# Patient Record
Sex: Male | Born: 1965 | Race: Black or African American | Hispanic: No | Marital: Married | Smoking: Never smoker
Health system: Southern US, Community
[De-identification: ages and names within clinical notes are randomized; demographics above are authoritative.]

## PROBLEM LIST (undated history)

## (undated) DIAGNOSIS — I5022 Chronic systolic (congestive) heart failure: Secondary | ICD-10-CM

## (undated) DIAGNOSIS — I10 Essential (primary) hypertension: Secondary | ICD-10-CM

## (undated) DIAGNOSIS — K219 Gastro-esophageal reflux disease without esophagitis: Secondary | ICD-10-CM

## (undated) DIAGNOSIS — E785 Hyperlipidemia, unspecified: Secondary | ICD-10-CM

## (undated) DIAGNOSIS — D303 Benign neoplasm of bladder: Secondary | ICD-10-CM

## (undated) HISTORY — PX: CORONARY ARTERY BYPASS GRAFT: SHX141

## (undated) HISTORY — DX: Hyperlipidemia, unspecified: E78.5

## (undated) HISTORY — PX: NO PAST SURGERIES: SHX2092

## (undated) HISTORY — DX: Essential (primary) hypertension: I10

## (undated) HISTORY — DX: Chronic systolic (congestive) heart failure: I50.22

## (undated) HISTORY — DX: Gastro-esophageal reflux disease without esophagitis: K21.9

## (undated) HISTORY — DX: Benign neoplasm of bladder: D30.3

---

## 2010-06-19 ENCOUNTER — Emergency Department (HOSPITAL_BASED_OUTPATIENT_CLINIC_OR_DEPARTMENT_OTHER)
Admission: EM | Admit: 2010-06-19 | Discharge: 2010-06-20 | Payer: Self-pay | Source: Home / Self Care | Admitting: Emergency Medicine

## 2010-07-12 ENCOUNTER — Ambulatory Visit: Payer: Self-pay | Admitting: Internal Medicine

## 2010-07-12 ENCOUNTER — Encounter: Payer: Self-pay | Admitting: Internal Medicine

## 2010-07-12 DIAGNOSIS — E785 Hyperlipidemia, unspecified: Secondary | ICD-10-CM

## 2010-07-12 DIAGNOSIS — B350 Tinea barbae and tinea capitis: Secondary | ICD-10-CM | POA: Insufficient documentation

## 2010-07-12 DIAGNOSIS — I10 Essential (primary) hypertension: Secondary | ICD-10-CM | POA: Insufficient documentation

## 2010-08-13 ENCOUNTER — Encounter: Payer: Self-pay | Admitting: Internal Medicine

## 2010-08-13 ENCOUNTER — Ambulatory Visit
Admission: RE | Admit: 2010-08-13 | Discharge: 2010-08-13 | Payer: Self-pay | Source: Home / Self Care | Attending: Internal Medicine | Admitting: Internal Medicine

## 2010-08-13 LAB — CONVERTED CEMR LAB
Albumin: 4.6 g/dL (ref 3.5–5.2)
Alkaline Phosphatase: 97 units/L (ref 39–117)
CO2: 30 meq/L (ref 19–32)
Chloride: 102 meq/L (ref 96–112)
Creatinine, Ser: 0.8 mg/dL (ref 0.40–1.50)
HDL: 44 mg/dL (ref >39)
LDL Cholesterol: 255 mg/dL — ABNORMAL HIGH (ref 0–99)
Potassium: 4.6 meq/L (ref 3.5–5.3)
TSH: 1.772 microintl units/mL (ref 0.350–4.500)
Total Bilirubin: 0.5 mg/dL (ref 0.3–1.2)
Total CHOL/HDL Ratio: 7.2
Total Protein: 7.4 g/dL (ref 6.0–8.3)
VLDL: 17 mg/dL (ref 0–40)

## 2010-08-14 ENCOUNTER — Telehealth: Payer: Self-pay | Admitting: Internal Medicine

## 2010-08-19 ENCOUNTER — Telehealth: Payer: Self-pay | Admitting: Internal Medicine

## 2010-08-21 ENCOUNTER — Encounter: Payer: Self-pay | Admitting: Internal Medicine

## 2010-09-03 NOTE — Assessment & Plan Note (Signed)
Summary: new to [facility]   Vital Signs:  Patient profile:   45 year old male Height:      73.5 inches Weight:      180.50 pounds BMI:     23.58 O2 Sat:      99 % on Room air Temp:     97.8 degrees F oral Pulse rate:   89 / minute Resp:     18 per minute BP sitting:   140 / 100  (right arm) Cuff size:   large  Vitals Entered By: Glendell Docker CMA (July 12, 2010 1:15 PM)  O2 Flow:  Room air CC: New patient  Is Patient Diabetic? No Pain Assessment Patient in pain? no      Comments establish care   Primary Care Provider:  Dondra Spry DO  CC:  New patient .  History of Present Illness: 45 y/o AA male to establish he was seen in nov in ER for perionychia - resolved with doxy doxy also helped with chronic rash on right side of face  he notes hx of elevated bp in the past occ checks bp at pharm.  SBP 140's and DBP 90-s no hx of coronary artery disease or cva  also reports hx of hyperlipidemia prev tx x 1 yr with lipitor 2005-2006 never followed up with PCP   Preventive Screening-Counseling & Management  Alcohol-Tobacco     Alcohol drinks/day: <1     Smoking Status: never  Caffeine-Diet-Exercise     Caffeine use/day: 2-5  beverages daily     Does Patient Exercise: no  Allergies (verified): No Known Drug Allergies  Past History:  Past Medical History: Hyperlipidemia - previously on lipitor and simvastatin Hypertension - hx of elevated BP readings  Past Surgical History: Denies surgical history    Family History: Family History Diabetes - GM no colon or prostate ca - no  raised by grandparents  Social History: Occupation: Merchandiser, 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         denies sexual dysfunction  Physical Exam  General:  alert, well-developed, and well-nourished.   Head:  normocephalic and atraumatic.   Eyes:  pupils equal, pupils round, and pupils reactive to light.   Ears:  R ear normal and L ear normal.   Mouth:  good dentition and pharynx pink and moist.   Neck:  No deformities, masses, or tenderness noted. Lungs:  normal respiratory effort, normal breath sounds, no crackles, and no wheezes.   Heart:  normal rate, regular rhythm, no murmur, and no gallop.   Abdomen:  soft, non-tender, normal bowel sounds, no masses, no hepatomegaly, and no splenomegaly.   Extremities:  No lower extremity edema Neurologic:  cranial nerves II-XII intact and gait normal.   Psych:  normally interactive, good eye contact, not anxious appearing, and not depressed appearing.     Impression & Recommendations:  Problem # 1:  HYPERTENSION (ICD-401.9)  His updated medication list for this problem includes:    Losartan Potassium 25 Mg Tabs (Losartan potassium) ..... One by mouth once daily    Amlodipine Besylate 2.5 Mg Tabs (Amlodipine besylate) ..... One by mouth once daily  Orders: T-Basic Metabolic Panel [id number]) T-TSH [phone])  BP today: 140/100  Problem # 2:  HYPERLIPIDEMIA (ICD-272.4) Pt counseled on diet and exercise. I provided educational material.  Orders: T-Hepatic Function [id number]) T-Lipid Profile [phone]) T-TSH [id number]) CRP, high sensitivity-[facility] ([id number])  Problem # 3:  TINEA BARBAE (ICD-110.0) use metrogel as directed improved with prev use of doxycycline  Complete Medication List: 1)  Losartan Potassium 25 Mg Tabs (Losartan potassium) .... One by mouth once daily 2)  Amlodipine Besylate 2.5 Mg Tabs (Amlodipine besylate) .... One by mouth once daily 3)  Metrogel 1 % Gel (Metronidazole) .... Apply two times a day x 2  weeks  Patient Instructions: 1)  Please schedule a follow-up appointment in 1 month. Prescriptions: METROGEL 1 % GEL (METRONIDAZOLE) apply two times a day x 2 weeks  #2 week x 1   Entered and Authorized by:   D. Thomos Lemons DO   Signed by:   D. Thomos Lemons DO on 07/12/2010   Method used:   [facility]        [facility] [phone][facility] (retail)       [address]       [place], [place]  [address]       Ph: [phone]       Fax: [phone]   RxID:   [id number] AMLODIPINE BESYLATE 2.5 MG TABS (AMLODIPINE BESYLATE) one by mouth once daily  #30 x 2   Entered and Authorized by:   D. Thomos Lemons DO   Signed by:   D. Thomos Lemons DO on 07/12/2010   Method used:   [facility] to        [facility] [phone]* (retail)       [address]       [place], [place]  [address]       Ph: [phone]       Fax: [phone]   RxID:   [id number] LOSARTAN POTASSIUM 25 MG TABS (LOSARTAN POTASSIUM) one by mouth once daily  #30 x 2   Entered and Authorized by:   D. Thomos Lemons DO   Signed by:   D. Thomos Lemons DO on 07/12/2010   Method used:   [facility] to        [facility] [phone]* (retail)       [address]       [place], [place]  [address]       Ph: [phone]       Fax: [phone]   RxID:   [id number]    Orders Added: 1)  T-Basic Metabolic Panel [id number] 2)  T-Hepatic Function [id number] 3)  T-Lipid Profile [80061-22930] 4)  T-TSH [[id number]] 5)  CRP, high sensitivity-[facility] [[id number]] 6)  New Patient Level III [99203]   Immunization History:  Tetanus/Td Immunization History:    Tetanus/Td:  historical (02/11/2005)  Influenza Immunization History:    Influenza:  declined (07/05/2010)   Contraindications/Deferment of Procedures/Staging:    Test/Procedure: FLU VAX    Reason for deferment: patient declined   Immunization History:  Tetanus/Td Immunization  History:    Tetanus/Td:  Historical (02/11/2005)  Influenza Immunization History:    Influenza:  Declined (07/05/2010)  Current Allergies (reviewed today): No known allergies     Appended Document: new to [facility]    Clinical Lists Changes  Orders: Added new Service order of EKG w/  Interpretation (93000) - Signed

## 2010-09-05 NOTE — Progress Notes (Signed)
Summary: Prior Auth Crestor 20mg   Phone Note Outgoing Call   Call placed by: Darral Dash Call placed to: Insurer Details for Reason: Prior Auth Summary of Call: Prior Auth for Crestor 20mg     form requested    Initial call taken by: Darral Dash,  August 19, 2010 9:02 AM  Follow-up for Phone Call        Form received and forwarded to Provider for completion and signature. Nicki Guadalajara Fergerson CMA Duncan Dull)  August 19, 2010 3:54 PM   Additional Follow-up for Phone Call Additional follow up Details #1::        they will not authorize crestor change to lipitor - see rx Additional Follow-up by: D. Thomos Lemons DO,  August 21, 2010 12:33 PM    Additional Follow-up for Phone Call Additional follow up Details #2::    call placed to patient at [phone], he has been advised of medication change. [phone], spoke with Pharmacy tech Johnathan Hausen, she was informed Crestor rx  was changed to Lipitor. Crestor Rx has been cancelled Follow-up by: Glendell Docker CMA,  August 21, 2010 3:20 PM  New/Updated Medications: LIPITOR 40 MG TABS (ATORVASTATIN CALCIUM) one by mouth once daily Prescriptions: LIPITOR 40 MG TABS (ATORVASTATIN CALCIUM) one by mouth once daily  #90 x 1   Entered and Authorized by:   D. Thomos Lemons DO   Signed by:   D. Thomos Lemons DO on 08/21/2010   Method used:   [facility]ronically to        [facility] [phone]* (retail)       [address]       [place], [place]  [address]       Ph: [phone]       Fax: [phone]   RxID:   [id number]

## 2010-09-05 NOTE — Progress Notes (Signed)
Summary: lab results  Phone Note Outgoing Call   Summary of Call: call pt - cholesterol very high - LDL 255.  I advise pt start choesterol medication.  see rx pt needs repeat FLT ad LFTs in 2 months, then OV Initial call taken by: D. Thomos Lemons DO,  August 14, 2010 8:43 AM  Follow-up for Phone Call        Pt notified per Dr Olegario Messier instruction. F/u with Dr Artist Pais scheduled for 10/15/10 @ 4:15pm. Lab order has been entered and faxed to the lab. Appt reminders have been mailed to the pt. Nicki Guadalajara Fergerson CMA (AAMA)  August 14, 2010 9:26 AM     New/Updated Medications: CRESTOR 20 MG TABS (ROSUVASTATIN CALCIUM) one by mouth once daily Prescriptions: CRESTOR 20 MG TABS (ROSUVASTATIN CALCIUM) one by mouth once daily  #30 x 3   Entered and Authorized by:   D. Thomos Lemons DO   Signed by:   D. Thomos Lemons DO on 08/14/2010   Method used:   [facility] to        [facility] [phone]* (retail)       [address]       [place], [place]  [address]       Ph: [phone]       Fax: [phone]   RxID:   [id number]

## 2010-09-05 NOTE — Assessment & Plan Note (Signed)
Summary: 1 month follow up/mhf   Vital Signs:  Patient profile:   45 year old male Height:      73.5 inches Weight:      180 pounds BMI:     23.51 O2 Sat:      98 % on Room air Temp:     98.0 degrees F oral Pulse rate:   70 / minute Resp:     16 per minute BP sitting:   108 / 80  (left arm) Cuff size:   large  Vitals Entered By: Glendell Docker CMA (August 13, 2010 3:54 PM)  O2 Flow:  Room air CC: 1 Month Follow up Is Patient Diabetic? No Pain Assessment Patient in pain? no      Comments no concerns, sleep has improved, would like a rx for doxycline to hel p clear up face, works better than metrogel   Primary Care Provider:  D. Thomos Lemons DO  CC:  1 Month Follow up.  History of Present Illness:  Hypertension Follow-Up      This is a 45 year old man who presents for Hypertension follow-up.  The patient denies lightheadedness and edema.  The patient denies the following associated symptoms: chest pain.  Compliance with medications (by patient report) has been near 100%.  The patient reports that dietary compliance has been fair.    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   Lungs:  normal respiratory effort, normal breath sounds, no crackles, and no wheezes.   Heart:  normal rate, regular rhythm, no murmur, and no gallop.     Skin:  ruddy hyperpigmented areas - right face  Psych:  normally interactive, good eye contact, not anxious appearing, and not depressed appearing.     Impression & Recommendations:  Problem # 1:  HYPERTENSION (ICD-401.9) Assessment Improved monitor BP at home.  If SBP < 100, pt to DC amlodipine  His updated medication list for this problem includes:    Losartan Potassium 25 Mg Tabs (Losartan potassium) ..... One by mouth once daily    Amlodipine Besylate 2.5 Mg Tabs (Amlodipine besylate) ..... One by mouth once daily  Orders: T-Basic Metabolic Panel [id number])  BP today: 108/80 Prior BP: 140/100 (07/12/2010)  Problem # 2:  HYPERLIPIDEMIA (ICD-272.4) repeat labs  Orders: T-Hepatic Function [id number]) T-Lipid Profile [phone]) T-TSH [id number])  Problem # 3:  TINEA BARBAE (ICD-110.0) Assessment: Deteriorated use doxy as directed also use metrogel for maintenance  Complete Medication List: 1)  Losartan Potassium 25 Mg Tabs (Losartan potassium) .... One by mouth once daily 2)  Amlodipine Besylate 2.5 Mg Tabs (Amlodipine besylate) .... One by mouth once daily 3)  Doxycycline Hyclate 100 Mg Tabs (Doxycycline hyclate) .... One by mouth once daily  Patient Instructions: 1)  Please schedule a follow-up appointment in 4 months. Prescriptions: AMLODIPINE BESYLATE 2.5 MG TABS (AMLODIPINE BESYLATE) one by mouth once daily  #90  x 1   Entered and Authorized by:   D. Thomos Lemons DO   Signed by:   D. Thomos Lemons DO on 08/13/2010   Method used:   [facility] to        [facility] [phone]* (retail)       [address]       [place], [place]  [address]       Ph: [phone]       Fax: [phone]   RxID:   [id number] LOSARTAN POTASSIUM 25 MG TABS (LOSARTAN POTASSIUM) one by mouth once daily  #90 x 1   Entered and Authorized by:   D. Thomos Lemons DO   Signed by:   D. Thomos Lemons DO on 08/13/2010   Method used:   Electronically to         [facility] [phone]* (retail)       [address]       [place], [place]  [address]       Ph: [phone]       Fax: [phone]   RxID:   [id number] DOXYCYCLINE HYCLATE 100 MG TABS (DOXYCYCLINE HYCLATE) one by mouth once daily  #10 x 0   Entered and Authorized by:   D. Thomos Lemons DO   Signed by:   D. Thomos Lemons DO on 08/13/2010   Method used:   Electronically to        [facility] [phone]* (retail)       [address]       [place], [place]  [address]       Ph: [phone]       Fax: [phone]   RxID:   [id number]    Orders Added: 1)  T-Basic Metabolic Panel [id number] 2)  T-Hepatic Function [id number] 3)  T-Lipid Profile [80061-22930] 4)  T-TSH [[id number]] 5)  Est. Patient Level III [[address]]    Current Allergies (reviewed today): No known allergies

## 2010-09-11 NOTE — Medication Information (Signed)
Summary: Prior autho for Crestor/[facility]  Prior autho for [employer]   Imported By: Sherian Rein 09/03/2010 09:24:55  _____________________________________________________________________  External Attachment:    Type:   Image     Comment:   External Document

## 2010-10-10 LAB — CONVERTED CEMR LAB
ALT: 27 units/L (ref 0–53)
AST: 20 units/L (ref 0–37)
Albumin: 4.6 g/dL (ref 3.5–5.2)
Alkaline Phosphatase: 106 units/L (ref 39–117)
Cholesterol: 171 mg/dL (ref 0–200)
HDL: 48 mg/dL (ref >39)
Indirect Bilirubin: 0.5 mg/dL (ref 0.0–0.9)
Total Protein: 7 g/dL (ref 6.0–8.3)
Triglycerides: 112 mg/dL (ref <150)

## 2010-10-14 ENCOUNTER — Telehealth: Payer: Self-pay | Admitting: Internal Medicine

## 2010-10-15 ENCOUNTER — Ambulatory Visit: Payer: Self-pay | Admitting: Internal Medicine

## 2010-10-18 ENCOUNTER — Ambulatory Visit (INDEPENDENT_AMBULATORY_CARE_PROVIDER_SITE_OTHER): Admitting: Internal Medicine

## 2010-10-18 ENCOUNTER — Encounter: Payer: Self-pay | Admitting: Internal Medicine

## 2010-10-18 DIAGNOSIS — B350 Tinea barbae and tinea capitis: Secondary | ICD-10-CM

## 2010-10-18 DIAGNOSIS — E785 Hyperlipidemia, unspecified: Secondary | ICD-10-CM

## 2010-10-18 DIAGNOSIS — I10 Essential (primary) hypertension: Secondary | ICD-10-CM

## 2010-10-22 NOTE — Progress Notes (Signed)
Summary: Call  A Nurse Report  Phone Note Other Incoming   Caller: CALL A NURSE Summary of Call: Office Message from Date: 10/11/2010 12:00:00 AM Time of Call: [phone] Faxed To: [facility] CallerAlferd Obryant Fax Number: [phone] Facility: N/A Patient: Robert Bennett, Beckers DOB: [birth year] Phone: [phone] Provider: Thomos Lemons Message: See info below Regarding Appointment: Yes Appt Date: 10/15/2010 Appt Time: 4:15:00 PM Provider: Thomos Lemons Reason: Details: Work schedule changed. Outcome: Instructed patient to call back on the next business day. Message Taken by: Delrae Rend, CSR FAX Call-A Initial call taken by: Glendell Docker CMA,  October 14, 2010 8:21 AM

## 2010-11-05 NOTE — Assessment & Plan Note (Signed)
Summary: 2 mth follow up/canceled in error/ss   Vital Signs:  Patient profile:   45 year old male Height:      73.5 inches Weight:      183 pounds BMI:     23.90 O2 Sat:      100 % on Room air Temp:     98.1 degrees F oral Pulse rate:   71 / minute Resp:     16 per minute BP sitting:   100 / 60  (right arm) Cuff size:   large  Vitals Entered By: Glendell Docker CMA (October 18, 2010 7:56 AM)  O2 Flow:  Room air CC: 2 month follow up  Is Patient Diabetic? No Pain Assessment Patient in pain? no      Comments referral to dermatologist, request refill on metrogel,    Primary Care Provider:  DThomos Lemons DO  CC:  2 month follow up .  History of Present Illness:  Hypertension Follow-Up      This is a 45 year old man who presents for Hypertension follow-up.  The patient reports lightheadedness.  The patient denies the following associated symptoms: chest pain and chest pressure.  Compliance with medications (by patient report) has been near 100%.  The patient reports that dietary compliance has been fair.    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   Lungs:  normal respiratory effort, normal breath sounds, no crackles, and no wheezes.   Heart:  normal rate, regular rhythm, no murmur, and no gallop.   Skin:  scattered hyperpigemented  areas mainly on right side of face   Impression & Recommendations:  Problem # 1:  TINEA BARBAE (ICD-110.0) Assessment Deteriorated initial use of doxy assoc partial clearing. unclear whether resistance to doxy developing change to minocycline take 50 mg x 1 month we discussed possible photosensitivity  Problem # 2:  HYPERTENSION (ICD-401.9) pt experiencing some fatigue in afternoon bp is low normal dc amlodipine  The following medications were removed from the medication list:    Amlodipine Besylate 2.5 Mg Tabs (Amlodipine besylate) ..... One by mouth once daily His updated medication list for this problem includes:    Losartan Potassium 25 Mg Tabs (Losartan potassium) ..... One by mouth once daily  BP today: 100/60 Prior BP: 108/80 (08/13/2010)  Labs Reviewed: K+: 4.6 (08/13/2010) Creat: : 0.80 (08/13/2010)   Chol: 171 (10/10/2010)   HDL: 48 (10/10/2010)   LDL: 101 (10/10/2010)   TG: 112 (10/10/2010)  Problem # 3:  HYPERLIPIDEMIA (ICD-272.4) Assessment: Improved  His updated medication list for this problem includes:    Lipitor 40 Mg Tabs (Atorvastatin calcium) ..... One by mouth once daily  Labs Reviewed: SGOT: 20 (10/10/2010)   SGPT: 27 (10/10/2010)   HDL:48 (10/10/2010), 44 (08/13/2010)  LDL:101 (10/10/2010), 255 (08/13/2010)  Chol:171 (10/10/2010), 316 (08/13/2010)  Trig:112 (10/10/2010), 87 (08/13/2010)  Complete Medication List: 1)  Losartan Potassium 25 Mg Tabs (Losartan potassium) .... One by mouth once daily 2)  Lipitor 40 Mg Tabs (Atorvastatin calcium) .... One by mouth once daily 3)  Minocycline Hcl 50 Mg Caps (Minocycline hcl) .... One by mouth once daily  Patient Instructions: 1)  Please schedule a follow-up appointment in 2 months. 2)  Stop amlodipine 3)  Do not take minocycline with any dairy products Prescriptions: MINOCYCLINE HCL 50 MG CAPS (MINOCYCLINE HCL) one by mouth once daily  #30 x 0   Entered and Authorized by:   D. Thomos Lemons DO   Signed  by:   D. Thomos Lemons DO on 10/18/2010   Method used:   [facility]ronically to        [facility] [phone]* (retail)       [address]       [place], [place]  [address]       Ph: [phone]       Fax: [phone]   RxID:   [id number]    Orders Added: 1)  Est. Patient Level III [24401]

## 2011-03-27 ENCOUNTER — Other Ambulatory Visit: Payer: Self-pay | Admitting: Internal Medicine

## 2011-04-03 ENCOUNTER — Encounter: Payer: Self-pay | Admitting: Internal Medicine

## 2011-04-04 ENCOUNTER — Ambulatory Visit (INDEPENDENT_AMBULATORY_CARE_PROVIDER_SITE_OTHER): Admitting: Internal Medicine

## 2011-04-04 ENCOUNTER — Encounter: Payer: Self-pay | Admitting: Internal Medicine

## 2011-04-04 DIAGNOSIS — K3189 Other diseases of stomach and duodenum: Secondary | ICD-10-CM

## 2011-04-04 DIAGNOSIS — K429 Umbilical hernia without obstruction or gangrene: Secondary | ICD-10-CM

## 2011-04-04 DIAGNOSIS — R1013 Epigastric pain: Secondary | ICD-10-CM

## 2011-04-06 DIAGNOSIS — R1013 Epigastric pain: Secondary | ICD-10-CM | POA: Insufficient documentation

## 2011-04-06 DIAGNOSIS — K429 Umbilical hernia without obstruction or gangrene: Secondary | ICD-10-CM | POA: Insufficient documentation

## 2011-04-06 NOTE — Assessment & Plan Note (Signed)
Begin ppi with dexilant samples po qd x 3 weeks given. Followup if no improvement or worsening.

## 2011-04-06 NOTE — Assessment & Plan Note (Signed)
asx and reducible. Discussed warning sx's that would prompt immediate evaluation. States understanding.

## 2011-04-06 NOTE — Progress Notes (Signed)
  Subjective:    Patient ID: Robert Bennett, male    DOB: Jun 15, 1966, 45 y.o.   MRN: [id number]  HPI Pt presents to clinic for followup of multiple medical problems. Notes abdominal discomfort without radiation, nausea or vomitting. No change in bowel habits and no relationship to food. Located epigastric episode. Has associated increased abdominal growling in the am. No exacerbating or alleviating factors. Has midline abdominal bulging with situps but not at location of pain. No other complaints.  Past Medical History  Diagnosis Date  . Hyperlipidemia     Previously on lipitor and simvastatin  . Hypertension     history of elevated BP readings   Past Surgical History  Procedure Date  . No past surgeries     reports that he has never smoked. He has never used smokeless tobacco. He reports that he drinks alcohol. His drug history not on file. family history is negative for Cancer. No Known Allergies   Review of Systems see hpi     Objective:   Physical Exam  Nursing note and vitals reviewed. Constitutional: He appears well-developed and well-nourished. No distress.  HENT:  Head: Normocephalic and atraumatic.  Right Ear: External ear normal.  Left Ear: External ear normal.  Eyes: Conjunctivae are normal. No scleral icterus.  Abdominal: Soft. Bowel sounds are normal. He exhibits no distension and no mass. There is tenderness. There is no rebound and no guarding.       Slight tenderness epigastric area. Midline ventral diathesis with valsalva. +small umbilibal hernia without redness, tenderness, firmness or warmth. +reducible  Skin: He is not diaphoretic.          Assessment & Plan:

## 2011-08-07 ENCOUNTER — Other Ambulatory Visit: Payer: Self-pay | Admitting: *Deleted

## 2011-08-07 MED ORDER — LOSARTAN POTASSIUM 25 MG PO TABS: 25.0000 mg | ORAL_TABLET | Freq: Every day | ORAL | Status: DC

## 2011-08-07 NOTE — Telephone Encounter (Signed)
[facility]'s Amlodipine and Losartan Potassium. Per 10/18/10 office note, amlodipine was denied as pt was experiencing symptoms of low BP.  Spoke to pt, he forgot that he was supposed to be off of it and states that he had been out of it and had not been taking it. Advised pt I would send refill of Losartan to pharmacy.

## 2011-11-04 ENCOUNTER — Other Ambulatory Visit: Payer: Self-pay | Admitting: Internal Medicine

## 2011-11-04 NOTE — Telephone Encounter (Signed)
Refill sent to pharmacy. Office visit due.

## 2011-12-02 ENCOUNTER — Other Ambulatory Visit: Payer: Self-pay | Admitting: Internal Medicine

## 2011-12-03 NOTE — Telephone Encounter (Signed)
One month with rf 1. No appt scheduled. Needs appt

## 2011-12-03 NOTE — Telephone Encounter (Signed)
Rx refill sent to pharmacy. 

## 2012-02-06 ENCOUNTER — Other Ambulatory Visit: Payer: Self-pay | Admitting: Internal Medicine

## 2012-02-06 NOTE — Telephone Encounter (Signed)
14 day supply of atorvastatin sent to pharmacy as Robert Bennett is past due for follow up. Previous refill stated Robert Bennett would be due for office visit for further refills and f/u has not been arranged. Please call Robert Bennett to schedule fasting follow up with Dr Rodena Medin.

## 2012-02-06 NOTE — Telephone Encounter (Signed)
Informed patient that a 14 day supply of atorvastatin has been sent to pharmacy and he did make a fasting follow for 02/16/12

## 2012-02-16 ENCOUNTER — Encounter: Payer: Self-pay | Admitting: Internal Medicine

## 2012-02-16 ENCOUNTER — Ambulatory Visit (INDEPENDENT_AMBULATORY_CARE_PROVIDER_SITE_OTHER): Admitting: Internal Medicine

## 2012-02-16 VITALS — BP 98/76 | HR 65 | Temp 98.0°F | Resp 16 | Ht 74.0 in | Wt 193.0 lb

## 2012-02-16 DIAGNOSIS — I10 Essential (primary) hypertension: Secondary | ICD-10-CM

## 2012-02-16 DIAGNOSIS — Z79899 Other long term (current) drug therapy: Secondary | ICD-10-CM

## 2012-02-16 DIAGNOSIS — E785 Hyperlipidemia, unspecified: Secondary | ICD-10-CM

## 2012-02-16 LAB — CBC WITH DIFFERENTIAL/PLATELET
Eosinophils Absolute: 0 10*3/uL (ref 0.0–0.7)
Hemoglobin: 15.3 g/dL (ref 13.0–17.0)
Lymphocytes Relative: 45 % (ref 12–46)
Lymphs Abs: 1.9 10*3/uL (ref 0.7–4.0)
MCH: 30.1 pg (ref 26.0–34.0)
Monocytes Relative: 13 % — ABNORMAL HIGH (ref 3–12)
Neutrophils Relative %: 40 % — ABNORMAL LOW (ref 43–77)
Platelets: 312 10*3/uL (ref 150–400)
RBC: 5.09 MIL/uL (ref 4.22–5.81)
WBC: 4.2 10*3/uL (ref 4.0–10.5)

## 2012-02-16 LAB — LIPID PANEL
HDL: 42 mg/dL (ref >39)
LDL Cholesterol: 182 mg/dL — ABNORMAL HIGH (ref 0–99)
Total CHOL/HDL Ratio: 5.7 Ratio
VLDL: 16 mg/dL (ref 0–40)

## 2012-02-16 LAB — HEPATIC FUNCTION PANEL
AST: 16 U/L (ref 0–37)
Albumin: 4.1 g/dL (ref 3.5–5.2)
Bilirubin, Direct: 0.1 mg/dL (ref 0.0–0.3)
Total Bilirubin: 0.6 mg/dL (ref 0.3–1.2)

## 2012-02-16 LAB — BASIC METABOLIC PANEL
CO2: 30 mEq/L (ref 19–32)
Chloride: 102 mEq/L (ref 96–112)
Glucose, Bld: 85 mg/dL (ref 70–99)
Potassium: 4.3 mEq/L (ref 3.5–5.3)
Sodium: 141 mEq/L (ref 135–145)

## 2012-02-16 MED ORDER — LOSARTAN POTASSIUM 25 MG PO TABS: 25.0000 mg | ORAL_TABLET | Freq: Every day | ORAL | Status: DC

## 2012-02-16 MED ORDER — ATORVASTATIN CALCIUM 40 MG PO TABS: 40.0000 mg | ORAL_TABLET | Freq: Every day | ORAL | Status: DC

## 2012-02-16 MED ORDER — METRONIDAZOLE 1 % EX GEL: Freq: Two times a day (BID) | CUTANEOUS | Status: DC

## 2012-02-16 NOTE — Progress Notes (Signed)
  Subjective:    Patient ID: Robert Bennett, male    DOB: 12/16/1965, 46 y.o.   MRN: [id number]  HPI Pt presents to clinic for followup of multiple medical problems. Last visit 8/12. Tolerating medication without adverse effect. BP low nl today but denies dizziness or fatigue. Requests refill of metrogel for face prn.   Past Medical History  Diagnosis Date  . Hyperlipidemia     Previously on lipitor and simvastatin  . Hypertension     history of elevated BP readings   Past Surgical History  Procedure Date  . No past surgeries     reports that he has never smoked. He has never used smokeless tobacco. He reports that he drinks alcohol. His drug history not on file. family history is negative for Cancer. No Known Allergies    Review of Systems see hpi     Objective:   Physical Exam  Physical Exam  Nursing note and vitals reviewed. Constitutional: Appears well-developed and well-nourished. No distress.  HENT:  Head: Normocephalic and atraumatic.  Right Ear: External ear normal.  Left Ear: External ear normal.  Eyes: Conjunctivae are normal. No scleral icterus.  Neck: Neck supple. Carotid bruit is not present.  Cardiovascular: Normal rate, regular rhythm and normal heart sounds.  Exam reveals no gallop and no friction rub.   No murmur heard. Pulmonary/Chest: Effort normal and breath sounds normal. No respiratory distress. He has no wheezes. no rales.  Lymphadenopathy:    He has no cervical adenopathy.  Neurological:Alert.  Skin: Skin is warm and dry. Not diaphoretic.  Psychiatric: Has a normal mood and affect.        Assessment & Plan:

## 2012-02-16 NOTE — Assessment & Plan Note (Signed)
Obtain lipid/lft. Rf statin.

## 2012-02-16 NOTE — Patient Instructions (Signed)
Please schedule fasting labs prior to next visit chem7-v58.69 and lipid/lft-272.4 

## 2012-02-16 NOTE — Assessment & Plan Note (Signed)
Low nl isolated reading. Asx. Continue ARB but recommend outpt bp log to be called to clinic for review. Obtain cbc and chem7

## 2012-02-24 ENCOUNTER — Telehealth: Payer: Self-pay | Admitting: *Deleted

## 2012-02-24 DIAGNOSIS — Z79899 Other long term (current) drug therapy: Secondary | ICD-10-CM

## 2012-02-24 DIAGNOSIS — E785 Hyperlipidemia, unspecified: Secondary | ICD-10-CM

## 2012-02-24 NOTE — Telephone Encounter (Signed)
Patient informed; understood & agreed. Lab orders for 01.2014 OV placed/SLS

## 2012-02-24 NOTE — Telephone Encounter (Signed)
Message copied by Regis Bill on Tue Feb 24, 2012 11:42 AM ------      Message from: Edwyna Perfect      Created: Mon Feb 23, 2012  8:56 PM       Chol quite high. Believe he was out of chol med temporarily. If so then take daily and needs lipid/lft 272.4 with next visit

## 2012-04-12 ENCOUNTER — Ambulatory Visit (INDEPENDENT_AMBULATORY_CARE_PROVIDER_SITE_OTHER): Admitting: Internal Medicine

## 2012-04-12 ENCOUNTER — Encounter: Payer: Self-pay | Admitting: Internal Medicine

## 2012-04-12 VITALS — BP 116/82 | HR 76 | Temp 98.2°F | Resp 16 | Wt 194.0 lb

## 2012-04-12 DIAGNOSIS — I10 Essential (primary) hypertension: Secondary | ICD-10-CM

## 2012-04-12 DIAGNOSIS — K429 Umbilical hernia without obstruction or gangrene: Secondary | ICD-10-CM

## 2012-04-12 DIAGNOSIS — R1013 Epigastric pain: Secondary | ICD-10-CM

## 2012-04-12 MED ORDER — ESOMEPRAZOLE MAGNESIUM 40 MG PO CPDR: 40.0000 mg | DELAYED_RELEASE_CAPSULE | Freq: Every day | ORAL | Status: DC

## 2012-04-22 DIAGNOSIS — R1013 Epigastric pain: Secondary | ICD-10-CM | POA: Insufficient documentation

## 2012-04-22 NOTE — Progress Notes (Signed)
  Subjective:    Patient ID: Robert Bennett, male    DOB: [birth year], 46 y.o.   MRN: [id number]  HPI patient presents to clinic for evaluation of abdominal discomfort. Notes abdominal bloating and grumbling of the stomach over the past four months. Denies true pain or tenderness. No association with food. No alleviating or exacerbating factors. Wondered if his umbilical hernia was the cause. The hernia is reducible and not hard or tender. States he is taking his blood pressure medication only one day a week. Blood pressure today reviewed as normotensive.  Past Medical History  Diagnosis Date  . Hyperlipidemia     Previously on lipitor and simvastatin  . Hypertension     history of elevated BP readings   Past Surgical History  Procedure Date  . No past surgeries     reports that he has never smoked. He has never used smokeless tobacco. He reports that he drinks alcohol. His drug history not on file. family history is negative for Cancer. No Known Allergies   Review of Systems see hpi     Objective:   Physical Exam  Nursing note and vitals reviewed. Constitutional: He appears well-developed and well-nourished. No distress.  HENT:  Head: Normocephalic and atraumatic.  Right Ear: External ear normal.  Left Ear: External ear normal.  Eyes: Conjunctivae normal are normal. No scleral icterus.  Abdominal: Soft. Bowel sounds are normal. He exhibits no distension and no mass. There is no rebound and no guarding.        Small no hernia. Easily reducible. Nontender and not hard. There is minimal epigastric discomfort to palpation. No rebound guarding or rigidity.  Skin: He is not diaphoretic.          Assessment & Plan:

## 2012-04-22 NOTE — Assessment & Plan Note (Signed)
Only taking one time a week. Stop medication. Monitor blood pressure and report results for review.

## 2012-04-22 NOTE — Assessment & Plan Note (Signed)
Asymptomatic. No indication for surgery. Continue to monitor

## 2012-04-22 NOTE — Assessment & Plan Note (Signed)
Begin Nexium 40 mg daily #20 given. Followup if no improvement or worsening.

## 2012-05-10 ENCOUNTER — Other Ambulatory Visit: Payer: Self-pay

## 2012-05-10 MED ORDER — ESOMEPRAZOLE MAGNESIUM 40 MG PO CPDR: 40.0000 mg | DELAYED_RELEASE_CAPSULE | Freq: Every day | ORAL | Status: DC

## 2012-07-04 ENCOUNTER — Other Ambulatory Visit: Payer: Self-pay | Admitting: Internal Medicine

## 2012-07-05 NOTE — Telephone Encounter (Signed)
DENIED-Last Rx 07.15.13 #30x6 to pharmacy/SLS

## 2012-08-16 ENCOUNTER — Ambulatory Visit: Admitting: Internal Medicine

## 2012-08-16 DIAGNOSIS — Z0289 Encounter for other administrative examinations: Secondary | ICD-10-CM

## 2012-12-12 ENCOUNTER — Other Ambulatory Visit: Payer: Self-pay | Admitting: Internal Medicine

## 2012-12-16 ENCOUNTER — Other Ambulatory Visit: Payer: Self-pay | Admitting: Internal Medicine

## 2013-01-03 ENCOUNTER — Encounter: Payer: Self-pay | Admitting: Family

## 2013-01-03 ENCOUNTER — Ambulatory Visit (INDEPENDENT_AMBULATORY_CARE_PROVIDER_SITE_OTHER): Admitting: Family

## 2013-01-03 VITALS — BP 134/84 | HR 82 | Temp 98.2°F | Resp 16 | Ht 74.0 in | Wt 195.0 lb

## 2013-01-03 DIAGNOSIS — K3189 Other diseases of stomach and duodenum: Secondary | ICD-10-CM

## 2013-01-03 DIAGNOSIS — E785 Hyperlipidemia, unspecified: Secondary | ICD-10-CM

## 2013-01-03 DIAGNOSIS — I10 Essential (primary) hypertension: Secondary | ICD-10-CM

## 2013-01-03 DIAGNOSIS — R1013 Epigastric pain: Secondary | ICD-10-CM

## 2013-01-03 DIAGNOSIS — N529 Male erectile dysfunction, unspecified: Secondary | ICD-10-CM

## 2013-01-03 MED ORDER — ESOMEPRAZOLE MAGNESIUM 40 MG PO CPDR: 40.0000 mg | DELAYED_RELEASE_CAPSULE | Freq: Every day | ORAL | Status: DC

## 2013-01-03 MED ORDER — LOSARTAN POTASSIUM 25 MG PO TABS: 25.0000 mg | ORAL_TABLET | Freq: Every day | ORAL | Status: DC

## 2013-01-03 MED ORDER — SILDENAFIL CITRATE 50 MG PO TABS: 50.0000 mg | ORAL_TABLET | Freq: Every day | ORAL | Status: DC | PRN

## 2013-01-03 MED ORDER — ATORVASTATIN CALCIUM 40 MG PO TABS: 40.0000 mg | ORAL_TABLET | Freq: Every day | ORAL | Status: DC

## 2013-01-03 NOTE — Progress Notes (Signed)
  Subjective:    Patient ID: Robert Bennett, male    DOB: [birth year], 47 y.o.   MRN: [id number]  HPI  Robert Bennett is a 47 yr old male who presents today for follow up.  1) HTN- He stopped his meds due to low blood pressure.  Reports that he had an elevated BP 160/110 at [facility].  He resumed his losartan 1 month ago.  Denies CP/SOB/Swelling.  2) Hyperlipidemia- currently maintained on atorvastatin.  Reports that he is tolerating atorvastatin, denies myalgia.   Tries to watch his diet, but not great at this.   3) GERD- reports that his GI symptoms are improved by nexium.  He reports that he is only using on a prn basis.    4) ED- requesting rx for viagra.     Review of Systems See HPI  Past Medical History  Diagnosis Date  . Hyperlipidemia     Previously on lipitor and simvastatin  . Hypertension     history of elevated BP readings    History   Social History  . Marital Status: Married    Spouse Name: N/A    Number of Children: 2  . Years of Education: N/A   Occupational History  . CLAIMS    Social History Main Topics  . Smoking status: Never Smoker   . Smokeless tobacco: Never Used  . Alcohol Use: Yes  . Drug Use: Not on file  . Sexually Active: Not on file   Other Topics Concern  . Not on file   Social History Narrative   Grew up in [place], [place]   Raised by grandparents    Past Surgical History  Procedure Laterality Date  . No past surgeries      Family History  Problem Relation Age of Onset  . Cancer Neg Hx     negative for colon and prostate    No Known Allergies  Current Outpatient Prescriptions on File Prior to Visit  Medication Sig Dispense Refill  . metroNIDAZOLE (METROGEL) 1 % gel Apply topically 2 (two) times daily.  45 g  1   No current facility-administered medications on file prior to visit.    BP 134/84  Pulse 82  Temp(Src) 98.2 F (36.8 C) (Oral)  Resp 16  Ht 6\' 2"  (1.88 m)  Wt 195 lb (88.451 kg)  BMI 25.03 kg/m2   SpO2 97%        Objective:   Physical Exam  Constitutional: He is oriented to person, place, and time. He appears well-developed and well-nourished. No distress.  HENT:  Head: Normocephalic.  Cardiovascular: Normal rate and regular rhythm.   No murmur heard. Pulmonary/Chest: Effort normal and breath sounds normal. No respiratory distress. He has no wheezes. He has no rales. He exhibits no tenderness.  Musculoskeletal: He exhibits no edema.  Lymphadenopathy:    He has no cervical adenopathy.  Neurological: He is alert and oriented to person, place, and time.  Psychiatric: He has a normal mood and affect. His behavior is normal. Judgment and thought content normal.          Assessment & Plan:

## 2013-01-03 NOTE — Patient Instructions (Addendum)
Please complete your lab work prior to leaving. Please schedule a follow up appointment in 6 months.   

## 2013-01-04 DIAGNOSIS — N529 Male erectile dysfunction, unspecified: Secondary | ICD-10-CM | POA: Insufficient documentation

## 2013-01-04 LAB — BASIC METABOLIC PANEL
CO2: 28 mEq/L (ref 19–32)
Chloride: 103 mEq/L (ref 96–112)
Creat: 0.84 mg/dL (ref 0.50–1.35)
Glucose, Bld: 110 mg/dL — ABNORMAL HIGH (ref 70–99)

## 2013-01-04 LAB — HEPATIC FUNCTION PANEL
ALT: 37 U/L (ref 0–53)
Indirect Bilirubin: 0.4 mg/dL (ref 0.0–0.9)
Total Protein: 7.2 g/dL (ref 6.0–8.3)

## 2013-01-04 LAB — LIPID PANEL
LDL Cholesterol: 118 mg/dL — ABNORMAL HIGH (ref 0–99)
Triglycerides: 118 mg/dL (ref <150)

## 2013-01-04 NOTE — Assessment & Plan Note (Signed)
Stable with prn nexium, continue same.

## 2013-01-04 NOTE — Assessment & Plan Note (Signed)
BP looks good today on losartan, continue same, obtain bmet.

## 2013-01-04 NOTE — Assessment & Plan Note (Signed)
Tolerating statin, obtain FLP/LFT.   

## 2013-01-04 NOTE — Assessment & Plan Note (Signed)
Pt requests trial of viagra.  He will fill one rx locally then continue refills at the [place].

## 2013-01-09 ENCOUNTER — Encounter: Payer: Self-pay | Admitting: Family

## 2013-01-14 ENCOUNTER — Telehealth: Payer: Self-pay | Admitting: *Deleted

## 2013-01-14 DIAGNOSIS — R7309 Other abnormal glucose: Secondary | ICD-10-CM

## 2013-01-14 NOTE — Telephone Encounter (Signed)
Notified pt and he voices understanding. Future lab order entered as he states he will return Monday for testing.

## 2013-01-14 NOTE — Telephone Encounter (Signed)
Message copied by Kathi Simpers on Fri Jan 14, 2013  1:39 PM ------      Message from: O'SULLIVAN, MELISSA      Created: Wed Jan 12, 2013 11:38 AM       Pls let pt know sugar mildly elevated.  I would like him to return for A1C (dx hyperglycemia) to rule out diabetes.  Cholesterol and liver testing look good. ------

## 2013-01-18 ENCOUNTER — Telehealth: Payer: Self-pay | Admitting: Family

## 2013-01-18 ENCOUNTER — Encounter: Payer: Self-pay | Admitting: Family

## 2013-01-18 DIAGNOSIS — R7303 Prediabetes: Secondary | ICD-10-CM | POA: Insufficient documentation

## 2013-01-18 NOTE — Telephone Encounter (Signed)
A1C shows borderline DM.  Reviewed with pt. Discussed diabetic diet and exercise.  Pt verbalizes understanding.

## 2013-01-19 ENCOUNTER — Other Ambulatory Visit: Payer: Self-pay | Admitting: Internal Medicine

## 2013-01-28 ENCOUNTER — Other Ambulatory Visit: Payer: Self-pay | Admitting: Family

## 2013-05-01 ENCOUNTER — Encounter (HOSPITAL_BASED_OUTPATIENT_CLINIC_OR_DEPARTMENT_OTHER): Payer: Self-pay | Admitting: *Deleted

## 2013-05-01 ENCOUNTER — Observation Stay (HOSPITAL_BASED_OUTPATIENT_CLINIC_OR_DEPARTMENT_OTHER)
Admission: EM | Admit: 2013-05-01 | Discharge: 2013-05-01 | Discharge status: Against Medical Advice | Attending: Emergency Medicine | Admitting: Emergency Medicine

## 2013-05-01 ENCOUNTER — Emergency Department (HOSPITAL_BASED_OUTPATIENT_CLINIC_OR_DEPARTMENT_OTHER)

## 2013-05-01 DIAGNOSIS — R0789 Other chest pain: Principal | ICD-10-CM | POA: Insufficient documentation

## 2013-05-01 DIAGNOSIS — E785 Hyperlipidemia, unspecified: Secondary | ICD-10-CM | POA: Insufficient documentation

## 2013-05-01 DIAGNOSIS — H539 Unspecified visual disturbance: Secondary | ICD-10-CM | POA: Insufficient documentation

## 2013-05-01 DIAGNOSIS — I4949 Other premature depolarization: Secondary | ICD-10-CM | POA: Insufficient documentation

## 2013-05-01 DIAGNOSIS — G459 Transient cerebral ischemic attack, unspecified: Secondary | ICD-10-CM

## 2013-05-01 DIAGNOSIS — I10 Essential (primary) hypertension: Secondary | ICD-10-CM | POA: Insufficient documentation

## 2013-05-01 DIAGNOSIS — Z79899 Other long term (current) drug therapy: Secondary | ICD-10-CM | POA: Insufficient documentation

## 2013-05-01 DIAGNOSIS — R209 Unspecified disturbances of skin sensation: Secondary | ICD-10-CM | POA: Insufficient documentation

## 2013-05-01 LAB — BASIC METABOLIC PANEL
BUN: 12 mg/dL (ref 6–23)
CO2: 29 mEq/L (ref 19–32)
Chloride: 101 mEq/L (ref 96–112)
Creatinine, Ser: 0.9 mg/dL (ref 0.50–1.35)
GFR calc non Af Amer: >90 mL/min (ref >90)
Glucose, Bld: 102 mg/dL — ABNORMAL HIGH (ref 70–99)
Potassium: 3.2 mEq/L — ABNORMAL LOW (ref 3.5–5.1)
Sodium: 141 mEq/L (ref 135–145)

## 2013-05-01 LAB — TROPONIN I: Troponin I: <0.3 ng/mL (ref <0.30)

## 2013-05-01 LAB — CBC
HCT: 45 % (ref 39.0–52.0)
Hemoglobin: 15.2 g/dL (ref 13.0–17.0)
MCH: 31.1 pg (ref 26.0–34.0)
MCHC: 33.8 g/dL (ref 30.0–36.0)
MCV: 92 fL (ref 78.0–100.0)
RBC: 4.89 MIL/uL (ref 4.22–5.81)
RDW: 12.7 % (ref 11.5–15.5)

## 2013-05-01 MED ORDER — NITROGLYCERIN 0.4 MG SL SUBL
SUBLINGUAL_TABLET | SUBLINGUAL | Status: AC
  Filled 2013-05-01: qty 25

## 2013-05-01 MED ORDER — ASPIRIN 325 MG PO TABS
325.0000 mg | ORAL_TABLET | ORAL | Status: AC
  Administered 2013-05-01: 325 mg via ORAL
  Filled 2013-05-01: qty 1

## 2013-05-01 MED ORDER — NITROGLYCERIN 0.4 MG SL SUBL
0.4000 mg | SUBLINGUAL_TABLET | SUBLINGUAL | Status: DC | PRN
  Filled 2013-05-01: qty 25

## 2013-05-01 NOTE — ED Provider Notes (Addendum)
CSN: [id number]     Arrival date & time 05/01/13  0107 History   First MD Initiated Contact with Patient 05/01/13 [phone]     Chief Complaint  Patient presents with  . Chest Pressure    (Consider location/radiation/quality/duration/timing/severity/associated sxs/prior Treatment) HPI This is a 47 year old male with a history of hyperlipidemia and hypertension. Yesterday he experienced a one-hour episode of right lower quadrantanopsia ("black spot") of the right eye. He states his vision in the left eye was unchanged. There was no associated pain or other neurologic deficits. It resolved unremarkably. He later had some watering of the left eye.  His reason for being here this morning is that he developed a mild pressure in his left pectoral region radiating to his left axilla. This was associated with some numbness in his left hand. It did not change with movement or palpation. It lasted about 45 minutes resolved with. There was no associated shortness of breath, diaphoresis or nausea.  Past Medical History  Diagnosis Date  . Hyperlipidemia     Previously on lipitor and simvastatin  . Hypertension     history of elevated BP readings   Past Surgical History  Procedure Laterality Date  . No past surgeries     Family History  Problem Relation Age of Onset  . Cancer Neg Hx     negative for colon and prostate   History  Substance Use Topics  . Smoking status: Never Smoker   . Smokeless tobacco: Never Used  . Alcohol Use: Yes     Comment: occasional     Review of Systems  All other systems reviewed and are negative.    Allergies  Review of patient's allergies indicates no known allergies.  Home Medications   Current Outpatient Rx  Name  Route  Sig  Dispense  Refill  . atorvastatin (LIPITOR) 40 MG tablet   Oral   Take 1 tablet (40 mg total) by mouth daily.   30 tablet   5   . esomeprazole (NEXIUM) 40 MG capsule   Oral   Take 40 mg by mouth daily as needed.         Marland Kitchen  losartan (COZAAR) 25 MG tablet   Oral   Take 1 tablet (25 mg total) by mouth daily.   30 tablet   6   . NEXIUM 40 MG capsule      TAKE 1 CAPSULE (40 MG TOTAL) BY MOUTH DAILY.   90 capsule   0     Due for yearly physical must see NP b4 additional  ...   . sildenafil (VIAGRA) 50 MG tablet   Oral   Take 1 tablet (50 mg total) by mouth daily as needed for erectile dysfunction.   10 tablet   5   . VIAGRA 50 MG tablet      TAKE 1 TABLET (50 MG TOTAL) BY MOUTH DAILY AS NEEDED FOR ERECTILE DYSFUNCTION.   10 tablet   5    BP 138/93  Pulse 82  Temp(Src) 98.2 F (36.8 C)  Resp 19  Wt 195 lb (88.451 kg)  BMI 25.03 kg/m2  SpO2 100%  Physical Exam General: Well-developed, well-nourished male in no acute distress; appearance consistent with age of record HENT: normocephalic; atraumatic Eyes: pupils equal, round and reactive to light; extraocular muscles intact Neck: supple Heart: regular rate and rhythm; no murmurs, rubs or gallops Lungs: clear to auscultation bilaterally Abdomen: soft; nondistended; nontender; bowel sounds present Extremities: No deformity; full range of motion;  pulses normal Neurologic: Awake, alert and oriented; motor function intact in all extremities and symmetric; no facial droop; visual fields intact bilaterally; negative Romberg; normal finger to nose; normal coordination and speech Skin: Warm and dry Psychiatric: Normal mood and affect    ED Course  Procedures (including critical care time)   MDM   Nursing notes and vitals signs, including pulse oximetry, reviewed.  Summary of this visit's results, reviewed by myself:  Labs:  Results for orders placed during the hospital encounter of 05/01/13 (from the past 24 hour(s))  TROPONIN I     Status: None   Collection Time    05/01/13  1:20 AM      Result Value Range   Troponin I <0.30  <0.30 ng/mL  BASIC METABOLIC PANEL     Status: Abnormal   Collection Time    05/01/13  1:20 AM      Result  Value Range   Sodium 141  135 - 145 mEq/L   Potassium 3.2 (*) 3.5 - 5.1 mEq/L   Chloride 101  96 - 112 mEq/L   CO2 29  19 - 32 mEq/L   Glucose, Bld 102 (*) 70 - 99 mg/dL   BUN 12  6 - 23 mg/dL   Creatinine, Ser 4.09  0.50 - 1.35 mg/dL   Calcium 81.1  8.4 - 91.4 mg/dL   GFR calc non Af Amer >90  >90 mL/min   GFR calc Af Amer >90  >90 mL/min  CBC     Status: None   Collection Time    05/01/13  1:20 AM      Result Value Range   WBC 6.7  4.0 - 10.5 K/uL   RBC 4.89  4.22 - 5.81 MIL/uL   Hemoglobin 15.2  13.0 - 17.0 g/dL   HCT 78.2  95.6 - 21.3 %   MCV 92.0  78.0 - 100.0 fL   MCH 31.1  26.0 - 34.0 pg   MCHC 33.8  30.0 - 36.0 g/dL   RDW 08.6  57.8 - 46.9 %   Platelets 257  150 - 400 K/uL  TROPONIN I     Status: None   Collection Time    05/01/13  3:40 AM      Result Value Range   Troponin I <0.30  <0.30 ng/mL    Imaging Studies: Ct Head Wo Contrast  05/01/2013   CLINICAL DATA:  Visual changes about the right eye. History of hypertension. Hyperlipidemia.  EXAM: CT HEAD WITHOUT CONTRAST  TECHNIQUE: Contiguous axial images were obtained from the base of the skull through the vertex without intravenous contrast.  COMPARISON:  None.  FINDINGS: Sinuses/Soft tissues: Normal appearance of the imaged orbits and globes. Mucosal thickening of the left maxillary sinus and ethmoid air cells. Clear mastoid air cells.  Intracranial: No mass lesion, hemorrhage, hydrocephalus, acute infarct, intra-axial, or extra-axial fluid collection.  IMPRESSION: 1.  No acute intracranial abnormality. 2. Sinus disease.   Electronically Signed   By: Jeronimo Greaves   On: 05/01/2013 03:08   Dg Chest Port 1 View  05/01/2013   *RADIOLOGY REPORT*  Clinical Data: Chest pain.  PORTABLE CHEST - 1 VIEW  Comparison: None available at time of study interpretation.  Findings: The cardiomediastinal silhouette is unremarkable.  The lungs are clear without pleural effusions or focal consolidations. The pulmonary vasculature is  unremarkable.   Trachea projects midline and there is no pneumothorax. Multiple EKG leads overlie the patient.  The included soft tissue planes and  osseous structures are unremarkable.  IMPRESSION: No acute cardiopulmonary process.   Original Report Authenticated By: Awilda Metro      EKG Interpretation:  Date & Time: 05/01/2013 1:25 AM  Rate: 83  Rhythm: normal sinus rhythm and premature ventricular contractions (PVC)  QRS Axis: left  Intervals: normal  ST/T Wave abnormalities: normal  Conduction Disutrbances:none  Narrative Interpretation:   Old EKG Reviewed: none available  4:07 AM Patient had been accepted for transfer to [facility] for TIA workup. He has changed his mind and has decided to leave AGAINST MEDICAL ADVICE. He was advised of the possibility of a stroke or heart attack as we have completed our workup. He states he understands this including the possibility of a fatal event. He was further advised that he is free to return at any time should he change his mind or should his symptoms change or worsen.    Hanley Seamen, MD 05/01/13 0407  Hanley Seamen, MD 05/01/13 5284

## 2013-05-01 NOTE — ED Notes (Signed)
Pt refusing admission. MD in to speak with patient. Pt verbazlies "im fine and i will come back if something happens".  Denies chest pain, headache, sob.

## 2013-05-01 NOTE — ED Notes (Addendum)
Pt states this morning he developed vision issues to his right eye. States things looked "black" if he was looking down. States that resolved throughout the day. States he had a pressure behind his left eye today that has resolved as well. States he got concerned when he started having ant chest pain. Describes as a pressure type pain. Denies any n/v or sob. Denies any hx of heart issues. States hx of htn.

## 2013-05-04 ENCOUNTER — Ambulatory Visit: Admitting: Family

## 2013-05-04 ENCOUNTER — Encounter: Payer: Self-pay | Admitting: Family

## 2013-05-04 ENCOUNTER — Ambulatory Visit (INDEPENDENT_AMBULATORY_CARE_PROVIDER_SITE_OTHER): Admitting: Family

## 2013-05-04 VITALS — BP 130/98 | HR 83 | Temp 98.6°F | Resp 16 | Ht 74.0 in | Wt 199.0 lb

## 2013-05-04 DIAGNOSIS — G459 Transient cerebral ischemic attack, unspecified: Secondary | ICD-10-CM

## 2013-05-04 DIAGNOSIS — R079 Chest pain, unspecified: Secondary | ICD-10-CM

## 2013-05-04 MED ORDER — ASPIRIN EC 81 MG PO TBEC: 81.0000 mg | DELAYED_RELEASE_TABLET | Freq: Every day | ORAL | Status: AC

## 2013-05-04 NOTE — Progress Notes (Signed)
  Subjective:    Patient ID: Robert Bennett, male    DOB: 1965/10/21, 47 y.o.   MRN: [id number]  HPI  Micah Flesher to ED on 9/28 with right quadranopsia, left sided chest pressure, left arm tingling. Had neg tryponin x 2, neg CT head, neg CXR.  K+ was mildly decreased.  He was recommended to be admitted for TIA work up.  Pt signed out AMA.    Since he has been home he is feeling fine.  He reports that he is tired in the afternoons.  Vision in the right eye has returned to baseline.  Denies hx of migraine. Denies HA. Denies CP.  Denies SOB, numbness or weakness.    Review of Systems See HPI  Past Medical History  Diagnosis Date  . Hyperlipidemia     Previously on lipitor and simvastatin  . Hypertension     history of elevated BP readings    History   Social History  . Marital Status: Married    Spouse Name: N/A    Number of Children: 2  . Years of Education: N/A   Occupational History  . CLAIMS    Social History Main Topics  . Smoking status: Never Smoker   . Smokeless tobacco: Never Used  . Alcohol Use: Yes     Comment: occasional   . Drug Use: Not on file  . Sexual Activity: Not on file   Other Topics Concern  . Not on file   Social History Narrative   Grew up in [place], [place]   Raised by grandparents    Past Surgical History  Procedure Laterality Date  . No past surgeries      Family History  Problem Relation Age of Onset  . Cancer Neg Hx     negative for colon and prostate    No Known Allergies  Current Outpatient Prescriptions on File Prior to Visit  Medication Sig Dispense Refill  . atorvastatin (LIPITOR) 40 MG tablet Take 1 tablet (40 mg total) by mouth daily.  30 tablet  5  . esomeprazole (NEXIUM) 40 MG capsule Take 40 mg by mouth daily as needed.      Marland Kitchen losartan (COZAAR) 25 MG tablet Take 1 tablet (25 mg total) by mouth daily.  30 tablet  6  . sildenafil (VIAGRA) 50 MG tablet Take 1 tablet (50 mg total) by mouth daily as needed for erectile dysfunction.   10 tablet  5   No current facility-administered medications on file prior to visit.    BP 130/98  Pulse 83  Temp(Src) 98.6 F (37 C) (Oral)  Resp 16  Ht 6\' 2"  (1.88 m)  Wt 199 lb 0.6 oz (90.284 kg)  BMI 25.54 kg/m2  SpO2 98%       Objective:   Physical Exam  Constitutional: He is oriented to person, place, and time. He appears well-developed and well-nourished. No distress.  Cardiovascular: Normal rate and regular rhythm.   No murmur heard. Pulmonary/Chest: Effort normal and breath sounds normal. No respiratory distress. He has no wheezes. He has no rales. He exhibits no tenderness.  Musculoskeletal: He exhibits no edema.  Neurological: He is alert and oriented to person, place, and time. No cranial nerve deficit. He exhibits normal muscle tone. Coordination normal.  Psychiatric: He has a normal mood and affect. His behavior is normal. Judgment and thought content normal.          Assessment & Plan:

## 2013-05-04 NOTE — Patient Instructions (Addendum)
You will be contacted about your tests. Follow up in 1 month. Go to the ER if you devlop recurrent symptoms.

## 2013-05-07 ENCOUNTER — Ambulatory Visit (HOSPITAL_BASED_OUTPATIENT_CLINIC_OR_DEPARTMENT_OTHER)
Admission: RE | Admit: 2013-05-07 | Discharge: 2013-05-07 | Disposition: A | Discharge status: Home / Self Care | Source: Ambulatory Visit | Attending: Family | Admitting: Family

## 2013-05-07 DIAGNOSIS — E785 Hyperlipidemia, unspecified: Secondary | ICD-10-CM | POA: Insufficient documentation

## 2013-05-07 DIAGNOSIS — R7309 Other abnormal glucose: Secondary | ICD-10-CM | POA: Insufficient documentation

## 2013-05-07 DIAGNOSIS — H53129 Transient visual loss, unspecified eye: Secondary | ICD-10-CM | POA: Insufficient documentation

## 2013-05-07 DIAGNOSIS — G459 Transient cerebral ischemic attack, unspecified: Secondary | ICD-10-CM

## 2013-05-07 DIAGNOSIS — I10 Essential (primary) hypertension: Secondary | ICD-10-CM | POA: Insufficient documentation

## 2013-05-09 ENCOUNTER — Telehealth: Payer: Self-pay | Admitting: Family

## 2013-05-09 NOTE — Telephone Encounter (Signed)
Patient scheduled for  Carotid,  Myocardial perfusion, & 2D Echo, called patient he want to cancel these appt , stated he would get back with me later to schedule,

## 2013-05-10 DIAGNOSIS — G459 Transient cerebral ischemic attack, unspecified: Secondary | ICD-10-CM | POA: Insufficient documentation

## 2013-05-10 NOTE — Assessment & Plan Note (Signed)
Symptoms consistent with TIA which is now resolved. Advised pt to complete stroke work up including mri brain, carotid duplex, 2-d echo.  He was agreeable at time of visit.

## 2013-05-11 ENCOUNTER — Encounter (HOSPITAL_COMMUNITY)

## 2013-05-16 ENCOUNTER — Other Ambulatory Visit: Payer: Self-pay | Admitting: Internal Medicine

## 2013-05-16 NOTE — Telephone Encounter (Signed)
eScribe request for refill on Metronidazole ABX Gel Last filled - 07.15.2013, #45x1 [Dr. Rodena Medin; Not on active med list] Last AEX - 10.01.14 Next AEX - 1 Month Please Advise/SLS

## 2013-05-25 ENCOUNTER — Ambulatory Visit: Admitting: Physician Assistant

## 2013-05-25 ENCOUNTER — Other Ambulatory Visit (HOSPITAL_COMMUNITY)

## 2013-05-26 ENCOUNTER — Telehealth (INDEPENDENT_AMBULATORY_CARE_PROVIDER_SITE_OTHER): Payer: Self-pay | Admitting: *Deleted

## 2013-05-26 ENCOUNTER — Encounter: Payer: Self-pay | Admitting: Physician Assistant

## 2013-05-26 ENCOUNTER — Ambulatory Visit (INDEPENDENT_AMBULATORY_CARE_PROVIDER_SITE_OTHER): Admitting: Physician Assistant

## 2013-05-26 ENCOUNTER — Encounter (HOSPITAL_COMMUNITY)

## 2013-05-26 VITALS — BP 142/90 | HR 90 | Temp 98.4°F | Resp 16 | Ht 74.0 in | Wt 201.5 lb

## 2013-05-26 DIAGNOSIS — K429 Umbilical hernia without obstruction or gangrene: Secondary | ICD-10-CM

## 2013-05-26 DIAGNOSIS — I10 Essential (primary) hypertension: Secondary | ICD-10-CM

## 2013-05-26 DIAGNOSIS — K469 Unspecified abdominal hernia without obstruction or gangrene: Secondary | ICD-10-CM

## 2013-05-26 MED ORDER — LOSARTAN POTASSIUM 25 MG PO TABS: 50.0000 mg | ORAL_TABLET | Freq: Every day | ORAL | Status: DC

## 2013-05-26 NOTE — Telephone Encounter (Signed)
Marge called back and reports that per her provider the pt doesn't need to go to the ER.  The hernia is partially reducible, firm and tender to touch, no nausea or vomiting.  He can be seen in a day or so next week.  I informed her that our MD states to go to the ER.  She states no.  I scheduled the appointment for tomorrow where there was an opening with Dr Biagio Quint since they refused our advice.

## 2013-05-26 NOTE — Progress Notes (Signed)
Patient ID: Robert Bennett, male   DOB: [birth year], 47 y.o.   MRN: [id number]  Patient presents to clinic today c/o pain of his umbilical hernia x 3 days.  Was diagnosed one-year ago with umbilical hernia that was reproducible and non-tender.  Treatment plan was for patient to monitor hernia and return if symptoms changed or if hernia enlarged.  Patient states hernia seems larger, especially when getting out of bed or using bathroom.  Hernia is tender and non-reproducible at umbilicus.  Denies N/V/C.  Last bowel movement this am with no abnormalities.  Denies history of abdominal trauma, prior abdominal surgery or heavy lifting.  Patient also with history of HTN on Cozaar 25 mg QD.  BP in clinic today is elevated at 142/90.  While this can likely be due to stress and pain, the patient states his BP always runs this high or higher at home, despite taking medication as prescribed.  Denies chest pain, SOB, heart palpitations, headache, lightheadedness, dizziness or syncope.   Past Medical History  Diagnosis Date  . Hyperlipidemia     Previously on lipitor and simvastatin  . Hypertension     history of elevated BP readings    Current Outpatient Prescriptions on File Prior to Visit  Medication Sig Dispense Refill  . aspirin EC 81 MG tablet Take 1 tablet (81 mg total) by mouth daily.      Marland Kitchen atorvastatin (LIPITOR) 40 MG tablet Take 1 tablet (40 mg total) by mouth daily.  30 tablet  5  . esomeprazole (NEXIUM) 40 MG capsule Take 40 mg by mouth daily as needed.      . metroNIDAZOLE (METROGEL) 1 % gel APPLY TOPICALLY 2 (TWO) TIMES DAILY.  60 g  1  . sildenafil (VIAGRA) 50 MG tablet Take 1 tablet (50 mg total) by mouth daily as needed for erectile dysfunction.  10 tablet  5   No current facility-administered medications on file prior to visit.    No Known Allergies  Family History  Problem Relation Age of Onset  . Cancer Neg Hx     negative for colon and prostate    History   Social History   . Marital Status: Married    Spouse Name: N/A    Number of Children: 2  . Years of Education: N/A   Occupational History  . CLAIMS    Social History Main Topics  . Smoking status: Never Smoker   . Smokeless tobacco: Never Used  . Alcohol Use: Yes     Comment: occasional   . Drug Use: None  . Sexual Activity: None   Other Topics Concern  . None   Social History Narrative   Grew up in [place], [place]   Raised by grandparents   ROS See HPI.  All other ROS are negative.   Filed Vitals:   05/26/13 0715  BP: 142/90  Pulse: 90  Temp: 98.4 F (36.9 C)  Resp: 16   Physical Exam  Vitals reviewed. Constitutional: He is oriented to person, place, and time and well-developed, well-nourished, and in no distress.  HENT:  Head: Normocephalic and atraumatic.  Eyes: Conjunctivae are normal.  Neck: Neck supple.  Cardiovascular: Normal rate, regular rhythm, normal heart sounds and intact distal pulses.   Pulmonary/Chest: Effort normal and breath sounds normal.  Abdominal: Soft. Bowel sounds are normal.  Presence of a 4 cm, tender and hardened umbilical hernia.  With increased abdominal pressure, hernia enlarges and is present midway up the abdomen.  Neurological: He  is alert and oriented to person, place, and time.  Skin: Skin is warm and dry. No rash noted.  Psychiatric: Affect normal.   Recent Results (from the past 2160 hour(s))  TROPONIN I     Status: None   Collection Time    05/01/13  1:20 AM      Result Value Range   Troponin I <0.30  <0.30 ng/mL   Comment:            Due to the release kinetics of cTnI,     a negative result within the first hours     of the onset of symptoms does not rule out     myocardial infarction with certainty.     If myocardial infarction is still suspected,     repeat the test at appropriate intervals.  BASIC METABOLIC PANEL     Status: Abnormal   Collection Time    05/01/13  1:20 AM      Result Value Range   Sodium 141  135 - 145 mEq/L    Potassium 3.2 (*) 3.5 - 5.1 mEq/L   Chloride 101  96 - 112 mEq/L   CO2 29  19 - 32 mEq/L   Glucose, Bld 102 (*) 70 - 99 mg/dL   BUN 12  6 - 23 mg/dL   Creatinine, Ser 3.97  0.50 - 1.35 mg/dL   Calcium 67.3  8.4 - 41.9 mg/dL   GFR calc non Af Amer >90  >90 mL/min   GFR calc Af Amer >90  >90 mL/min   Comment: (NOTE)     The eGFR has been calculated using the CKD EPI equation.     This calculation has not been validated in all clinical situations.     eGFR's persistently <90 mL/min signify possible Chronic Kidney     Disease.  CBC     Status: None   Collection Time    05/01/13  1:20 AM      Result Value Range   WBC 6.7  4.0 - 10.5 K/uL   RBC 4.89  4.22 - 5.81 MIL/uL   Hemoglobin 15.2  13.0 - 17.0 g/dL   HCT 37.9  02.4 - 09.7 %   MCV 92.0  78.0 - 100.0 fL   MCH 31.1  26.0 - 34.0 pg   MCHC 33.8  30.0 - 36.0 g/dL   RDW 35.3  29.9 - 24.2 %   Platelets 257  150 - 400 K/uL  TROPONIN I     Status: None   Collection Time    05/01/13  3:40 AM      Result Value Range   Troponin I <0.30  <0.30 ng/mL   Comment:            Due to the release kinetics of cTnI,     a negative result within the first hours     of the onset of symptoms does not rule out     myocardial infarction with certainty.     If myocardial infarction is still suspected,     repeat the test at appropriate intervals.   Assessment/Plan: Umbilical hernia Symptomatic.  Large.  Referral to General Surgery this am.  Regardless of treatment plan, encourage increased water intake and daily fiber supplement.  HYPERTENSION Increase cozaar to 50 mg QD.  Follow-up with myself or PCP Peggyann Juba in 2 weeks.

## 2013-05-26 NOTE — Assessment & Plan Note (Signed)
Symptomatic.  Large.  Referral to General Surgery this am.  Regardless of treatment plan, encourage increased water intake and daily fiber supplement.

## 2013-05-26 NOTE — Telephone Encounter (Signed)
Received a call this morning from Marge at [facility] regarding this patient.  I spoke to Dr. Derrell Lolling who stated he never said for the patient to be seen in urgent office and that he would have needed more information.  The office note from [facility] was printed and taken to Dr. Derrell Lolling in office for review who instructed that the patient needs to go to the ED.  Called Marge back to inform her of the surgeons recommendations.

## 2013-05-26 NOTE — Assessment & Plan Note (Signed)
Increase cozaar to 50 mg QD.  Follow-up with myself or PCP Peggyann Juba in 2 weeks.

## 2013-05-26 NOTE — Patient Instructions (Signed)
Please increase you blood pressure medication to 2 tablets a day (total of 50 mg).  Follow-up in 2 weeks with myself or PCP Sandford Craze.  You will be contacted by a general surgeon for evaluation of hernia.  If you start having severe abdominal pain, nausea/vomiting, etc., then please go directly to the ER.  I also recommend you start a daily fiber supplement as constipation and straining with bowel movements make your hernia worse.

## 2013-05-27 ENCOUNTER — Ambulatory Visit (HOSPITAL_COMMUNITY)
Admission: RE | Admit: 2013-05-27 | Discharge: 2013-05-27 | Disposition: A | Discharge status: Home / Self Care | Source: Ambulatory Visit | Attending: General Surgery | Admitting: General Surgery

## 2013-05-27 ENCOUNTER — Telehealth (INDEPENDENT_AMBULATORY_CARE_PROVIDER_SITE_OTHER): Payer: Self-pay

## 2013-05-27 ENCOUNTER — Encounter (INDEPENDENT_AMBULATORY_CARE_PROVIDER_SITE_OTHER): Payer: Self-pay | Admitting: General Surgery

## 2013-05-27 ENCOUNTER — Ambulatory Visit (INDEPENDENT_AMBULATORY_CARE_PROVIDER_SITE_OTHER): Admitting: General Surgery

## 2013-05-27 ENCOUNTER — Encounter (HOSPITAL_COMMUNITY): Payer: Self-pay | Admitting: *Deleted

## 2013-05-27 ENCOUNTER — Other Ambulatory Visit (INDEPENDENT_AMBULATORY_CARE_PROVIDER_SITE_OTHER): Payer: Self-pay

## 2013-05-27 VITALS — BP 138/80 | HR 74 | Temp 97.2°F | Resp 14 | Ht 74.0 in | Wt 200.0 lb

## 2013-05-27 DIAGNOSIS — K420 Umbilical hernia with obstruction, without gangrene: Secondary | ICD-10-CM

## 2013-05-27 DIAGNOSIS — N200 Calculus of kidney: Secondary | ICD-10-CM | POA: Insufficient documentation

## 2013-05-27 DIAGNOSIS — K429 Umbilical hernia without obstruction or gangrene: Secondary | ICD-10-CM | POA: Insufficient documentation

## 2013-05-27 DIAGNOSIS — N329 Bladder disorder, unspecified: Secondary | ICD-10-CM | POA: Insufficient documentation

## 2013-05-27 MED ORDER — IOHEXOL 300 MG/ML  SOLN
50.0000 mL | Freq: Once | INTRAMUSCULAR | Status: AC | PRN
  Administered 2013-05-27: 50 mL via ORAL

## 2013-05-27 MED ORDER — IOHEXOL 300 MG/ML  SOLN
100.0000 mL | Freq: Once | INTRAMUSCULAR | Status: AC | PRN
  Administered 2013-05-27: 100 mL via INTRAVENOUS

## 2013-05-27 NOTE — Progress Notes (Signed)
Patient ID: Robert Bennett, male   DOB: [birth year], 47 y.o.   MRN: [id number]  Chief Complaint  Patient presents with  . New Evaluation    eval ventral hernia    HPI Robert Bennett is a 47 y.o. male. this patient was referred by Robert Bennett  For evaluation of an umbilical hernia. He has known about this for over a year now and has always been a fairly small hernia it has been asymptomatic up until about one week ago. He says about a week ago it became more tender and now is tender to palpation in the immediate area. It has been increasing in size over the last year but he denies any obstructive symptoms such as nausea or vomiting or fevers or chills and states that his bowels are normal. He does not have a colonoscopy. He says that he has always been able to reduce this but over the last week he is not try to reduce it due to tenderness  HPI  Past Medical History  Diagnosis Date  . Hyperlipidemia     Previously on lipitor and simvastatin  . Hypertension     history of elevated BP readings  . GERD (gastroesophageal reflux disease)     Past Surgical History  Procedure Laterality Date  . No past surgeries      Family History  Problem Relation Age of Onset  . Cancer Neg Hx     negative for colon and prostate    Social History History  Substance Use Topics  . Smoking status: Never Smoker   . Smokeless tobacco: Never Used  . Alcohol Use: Yes     Comment: occasional     No Known Allergies  Current Outpatient Prescriptions  Medication Sig Dispense Refill  . aspirin EC 81 MG tablet Take 1 tablet (81 mg total) by mouth daily.      Marland Kitchen atorvastatin (LIPITOR) 40 MG tablet Take 1 tablet (40 mg total) by mouth daily.  30 tablet  5  . esomeprazole (NEXIUM) 40 MG capsule Take 40 mg by mouth daily as needed.      Marland Kitchen losartan (COZAAR) 25 MG tablet Take 2 tablets (50 mg total) by mouth daily.  30 tablet  6  . metroNIDAZOLE (METROGEL) 1 % gel APPLY TOPICALLY 2 (TWO) TIMES DAILY.  60  g  1  . sildenafil (VIAGRA) 50 MG tablet Take 1 tablet (50 mg total) by mouth daily as needed for erectile dysfunction.  10 tablet  5   No current facility-administered medications for this visit.    Review of Systems Review of Systems All other review of systems negative or noncontributory except as stated in the HPI  Blood pressure 138/80, pulse 74, temperature 97.2 F (36.2 C), temperature source Temporal, resp. rate 14, height 6\' 2"  (1.88 m), weight 200 lb (90.719 kg).  Physical Exam Physical Exam Physical Exam  Physical Exam  Vitals reviewed. Constitutional: He is oriented to person, place, and time. He appears well-developed and well-nourished. No distress.  HENT:  Head: Normocephalic and atraumatic.  Mouth/Throat: No oropharyngeal exudate.  Eyes: Conjunctivae and EOM are normal. Pupils are equal, round, and reactive to light. Right eye exhibits no discharge. Left eye exhibits no discharge. No scleral icterus.  Neck: Normal range of motion. No tracheal deviation present.  Cardiovascular: Normal rate, regular rhythm and normal heart sounds.   Pulmonary/Chest: Effort normal and breath sounds normal. No stridor. No respiratory distress. He has no wheezes. He has no rales. He  exhibits no tenderness.  Abdominal: Soft. Bowel sounds are normal. He exhibits no distension and no mass. There is no tenderness. There is no rebound and no guarding. He has a small to moderate-sized umbilical hernia which is mildly tender but he does have what appears to be used some incarceration of likely some intra-abdominal fat. He is tender only in this area and does not have any peritonitis. He has a midline diastases but is otherwise nondistended. Musculoskeletal: Normal range of motion. He exhibits no edema and no tenderness.  Neurological: He is alert and oriented to person, place, and time.  Skin: Skin is warm and dry. No rash noted. He is not diaphoretic. No erythema. No pallor.  Psychiatric: He has a  normal mood and affect. His behavior is normal. Judgment and thought content normal.      Data Reviewed   Assessment    Incarcerated umbilical hernia Think that he does have an incarcerated umbilical hernia and given his tenderness and the lack of ability to reduce this over the last week I have recommended a local hernia repair. I doubt that this is extremely thin intestines as he really doesn't have signs and symptoms consistent with this. He has no obstructive symptoms no peritonitis. However, I would like to repair this as soon as possible.  I discussed with him the options and the procedure. Discussed the risks of infection, bleeding, pain, scarring, recurrence, need for mesh, bowel injury, and persistent pain and he expressed understanding and would elect to proceed with umbilical hernia repair. We will set him up for umbilical hernia with possible mesh tomorrow     Plan    We will plan for umbilical hernia repair with possible mesh tomorrow        Robert Bennett 05/27/2013, 10:42 AM

## 2013-05-27 NOTE — Progress Notes (Signed)
Spoke with Elease Hashimoto at office of [facility] with Dr Biagio Quint and made her aware that patient in ED on 05/01/13 and had negative let sided chest pressure and vision problems and according to note of Sandford Craze dated 05/04/13 in [facility] patient was recommended to be admitted to for workup and patient checked out AMA.  Patient also states he is taking over the counter weight loss medication which he does not know the name of at present but will bring name of drug on 05/28/13.

## 2013-05-27 NOTE — Telephone Encounter (Signed)
[facility] preop calling to give heads up on the pt that is scheduled for surgery tomorrow with Dr Biagio Quint. Albin Felling was reviewing the pt's chart which showed the pt ended up in the ER on 05/01/13 with chest pressure and arm tingling but the pt checked him self out MIA. The pt has never done the work up now since the ED visit and they just want to make sure Dr Biagio Quint is aware of this for the surgery tomorrow. I notified Neysa Bonito and she will call Dr Biagio Quint.

## 2013-05-27 NOTE — Telephone Encounter (Signed)
Rec'd message that pre-op called regarding patient's ED visit on 05/01/13.  I have sent a message Dr. Delice Lesch pager and mobile phone as well as left a voicemail to advise of patient's previous visit to ED on 05/01/13 dx of TIA.

## 2013-05-27 NOTE — Progress Notes (Unsigned)
Patient ID: Robert Bennett, male   DOB: 18-Dec-1965, 47 y.o.   MRN: [id number] I spoke with him about his CT scan results which are below. I do not think he needs an urgent operation given that there is no intestine in the hernia.  So, the operation for tomorrow has been canceled and will be scheduled at a later date. I did speak with him about seeing a urologist for the nodule seen in the bladder. He also knows about the small kidney stone. I will discuss this with Dr. Biagio Quint as well.   IMPRESSION:  1. Fat containing periumbilical hernia with inflammatory changes  compatible with incarceration. Single loop was small bowel all wall  approaches the hernia but there is no obstruction or evidence of  herniation of the small bowel into the hernia defect.  2. 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 Followup cystoscopy recommended.  3. Nonobstructing bilateral renal collecting system calculi

## 2013-05-27 NOTE — Telephone Encounter (Signed)
Unable to contact patient.  Called and Spoke to patient's wife regarding Ct abd that Dr. Biagio Quint has ordered for this evening to further assess the patient incarcerated umbilical hernia.  Patient to go to [facility] ED for registration for CT.  Call report to our on-call surgeon.  We will contact patient with results and the next step on whether patient will have emergent surgery tomorrow or as an elective procedure at a later time.

## 2013-05-28 ENCOUNTER — Ambulatory Visit (HOSPITAL_COMMUNITY): Admission: RE | Admit: 2013-05-28 | Source: Ambulatory Visit | Admitting: General Surgery

## 2013-05-28 SURGERY — REPAIR, HERNIA, UMBILICAL, ADULT
Anesthesia: General

## 2013-05-31 ENCOUNTER — Other Ambulatory Visit (INDEPENDENT_AMBULATORY_CARE_PROVIDER_SITE_OTHER): Payer: Self-pay

## 2013-05-31 ENCOUNTER — Telehealth (INDEPENDENT_AMBULATORY_CARE_PROVIDER_SITE_OTHER): Payer: Self-pay

## 2013-05-31 DIAGNOSIS — C679 Malignant neoplasm of bladder, unspecified: Secondary | ICD-10-CM

## 2013-05-31 NOTE — Telephone Encounter (Signed)
Called patient to discuss referral to urology and cardiology follow up appointment.  Patient not available, left message for patient to return my call.  Referral to [facility] has been submitted on 05/31/13 @ 5:12pm.

## 2013-05-31 NOTE — Telephone Encounter (Signed)
Message copied by Maryan Puls on Tue May 31, 2013  5:10 PM ------      Message from: Lodema Pilot      Created: Tue May 31, 2013  4:49 PM       Did Mr Craun get set up for his cardiac tests by his primary doc?  He also need urology referral for the finding in his bladder. ------

## 2013-06-01 ENCOUNTER — Telehealth (INDEPENDENT_AMBULATORY_CARE_PROVIDER_SITE_OTHER): Payer: Self-pay | Admitting: *Deleted

## 2013-06-01 NOTE — Telephone Encounter (Signed)
I spoke with pt to inform him of his appt with Dr. Isabel Caprice at [facility] on 06/17/13 at 9:45am.  I provided the pt with their address and phone number.

## 2013-06-10 ENCOUNTER — Encounter (INDEPENDENT_AMBULATORY_CARE_PROVIDER_SITE_OTHER): Admitting: General Surgery

## 2013-06-15 ENCOUNTER — Ambulatory Visit: Admitting: Family

## 2013-06-15 ENCOUNTER — Telehealth (INDEPENDENT_AMBULATORY_CARE_PROVIDER_SITE_OTHER): Payer: Self-pay | Admitting: General Surgery

## 2013-06-15 NOTE — Telephone Encounter (Signed)
I received notification from the urologist that he did not show for his appt. And did not reschedule.  I called him on his mobile phone and he said that he was waiting for reimbursement from insurance for some of his medical expenses prior to completing workup.  I again notified him of the concern for a bladder mass and malignancy and stressed the importance of the urology follow up.  He said that he remembered and would schedule the visits when his reimbursement came in.

## 2013-07-04 ENCOUNTER — Ambulatory Visit: Admitting: Family

## 2013-10-09 ENCOUNTER — Other Ambulatory Visit: Payer: Self-pay | Admitting: Family

## 2013-10-10 NOTE — Telephone Encounter (Signed)
30 day supply atorvastatin sent to pharmacy. Pt is past due for follow up of BP.  Please call pt to arrange appt before further refills are due.

## 2013-10-18 NOTE — Telephone Encounter (Signed)
Informed patient of medication refill and he scheduled appointment for 11/07/13

## 2013-11-07 ENCOUNTER — Ambulatory Visit: Admitting: Family

## 2013-11-21 ENCOUNTER — Encounter: Payer: Self-pay | Admitting: Family

## 2013-11-21 ENCOUNTER — Ambulatory Visit (INDEPENDENT_AMBULATORY_CARE_PROVIDER_SITE_OTHER): Admitting: Family

## 2013-11-21 VITALS — BP 110/86 | HR 81 | Temp 98.4°F | Resp 16 | Ht 74.0 in | Wt 198.0 lb

## 2013-11-21 DIAGNOSIS — R739 Hyperglycemia, unspecified: Secondary | ICD-10-CM

## 2013-11-21 DIAGNOSIS — R7303 Prediabetes: Secondary | ICD-10-CM

## 2013-11-21 DIAGNOSIS — I10 Essential (primary) hypertension: Secondary | ICD-10-CM

## 2013-11-21 DIAGNOSIS — E785 Hyperlipidemia, unspecified: Secondary | ICD-10-CM

## 2013-11-21 DIAGNOSIS — K429 Umbilical hernia without obstruction or gangrene: Secondary | ICD-10-CM

## 2013-11-21 DIAGNOSIS — N3289 Other specified disorders of bladder: Secondary | ICD-10-CM

## 2013-11-21 DIAGNOSIS — R7309 Other abnormal glucose: Secondary | ICD-10-CM

## 2013-11-21 NOTE — Patient Instructions (Signed)
Please complete lab work prior to leaving. You will be contacted re: your referral to urology. Please let me know if you have not heard back in 1 week about this appointment. Follow up in 2 months.

## 2013-11-21 NOTE — Progress Notes (Signed)
Subjective:    Patient ID: Robert Bennett, male    DOB: [birth year], 48 y.o.   MRN: [id number]  HPI  Mr. Mabry is a 48 yr old male who presents today for follow up of multiple medical problems:  1) HTN- maintained on losartan.   BP Readings from Last 3 Encounters:  11/21/13 110/86  05/27/13 138/80  05/26/13 142/90   2) Hyperlipidemia-  Currently maintained on atorvastatin. Denies myalgia.   Lab Results  Component Value Date   CHOL 182 01/03/2013   HDL 40 01/03/2013   LDLCALC 118* 01/03/2013   TRIG 118 01/03/2013   CHOLHDL 4.6 01/03/2013   3) Hyperglycemia-   Lab Results  Component Value Date   HGBA1C 6.0* 01/17/2013   4) Hernia- Pt saw surgeon on in October 2014.  He underwent a CT of the abdomen.  CT noted periumbilical hernia but also made note of 109mm transitional cell carcinoma in the inter trigone portion of the urinary bladder.  Surgeon recommended that he be evaluated by urology. Patient was referred to [facility] urology but did not follow through with appointment due to their office being out of network.     Review of Systems See HPI  Past Medical History  Diagnosis Date  . Hyperlipidemia     Previously on lipitor and simvastatin  . Hypertension     history of elevated BP readings  . GERD (gastroesophageal reflux disease)     History   Social History  . Marital Status: Married    Spouse Name: N/A    Number of Children: 2  . Years of Education: N/A   Occupational History  . CLAIMS    Social History Main Topics  . Smoking status: Never Smoker   . Smokeless tobacco: Never Used  . Alcohol Use: Yes     Comment: occasional   . Drug Use: No  . Sexual Activity: Not on file   Other Topics Concern  . Not on file   Social History Narrative   Grew up in [place], [place]   Raised by grandparents    Past Surgical History  Procedure Laterality Date  . No past surgeries      Family History  Problem Relation Age of Onset  . Cancer Neg Hx     negative for colon  and prostate    No Known Allergies  Current Outpatient Prescriptions on File Prior to Visit  Medication Sig Dispense Refill  . aspirin EC 81 MG tablet Take 1 tablet (81 mg total) by mouth daily.      Marland Kitchen esomeprazole (NEXIUM) 40 MG capsule Take 40 mg by mouth daily as needed.      Marland Kitchen losartan (COZAAR) 25 MG tablet Take 2 tablets (50 mg total) by mouth daily.  30 tablet  6  . metroNIDAZOLE (METROGEL) 1 % gel APPLY TOPICALLY 2 (TWO) TIMES DAILY.  60 g  1  . sildenafil (VIAGRA) 50 MG tablet Take 1 tablet (50 mg total) by mouth daily as needed for erectile dysfunction.  10 tablet  5   No current facility-administered medications on file prior to visit.    BP 110/86  Pulse 81  Temp(Src) 98.4 F (36.9 C) (Oral)  Resp 16  Ht 6\' 2"  (1.88 m)  Wt 198 lb 0.6 oz (89.83 kg)  BMI 25.42 kg/m2  SpO2 98%       Objective:   Physical Exam  Constitutional: He is oriented to person, place, and time. He appears well-developed and well-nourished. No  distress.  HENT:  Head: Normocephalic and atraumatic.  Cardiovascular: Normal rate and regular rhythm.   No murmur heard. Pulmonary/Chest: Effort normal and breath sounds normal. No respiratory distress. He has no wheezes. He has no rales. He exhibits no tenderness.  Neurological: He is alert and oriented to person, place, and time.  Psychiatric: He has a normal mood and affect. His behavior is normal. Judgment and thought content normal.          Assessment & Plan:

## 2013-11-22 ENCOUNTER — Telehealth: Payer: Self-pay | Admitting: Family

## 2013-11-22 DIAGNOSIS — N3289 Other specified disorders of bladder: Secondary | ICD-10-CM | POA: Insufficient documentation

## 2013-11-22 DIAGNOSIS — E785 Hyperlipidemia, unspecified: Secondary | ICD-10-CM

## 2013-11-22 LAB — LIPID PANEL
CHOL/HDL RATIO: 5.5 ratio
Cholesterol: 254 mg/dL — ABNORMAL HIGH (ref 0–200)
HDL: 46 mg/dL (ref >39)
LDL CALC: 183 mg/dL — AB (ref 0–99)
TRIGLYCERIDES: 124 mg/dL (ref <150)
VLDL: 25 mg/dL (ref 0–40)

## 2013-11-22 LAB — HEMOGLOBIN A1C
Hgb A1c MFr Bld: 6.2 % — ABNORMAL HIGH (ref <5.7)
Mean Plasma Glucose: 131 mg/dL — ABNORMAL HIGH (ref <117)

## 2013-11-22 LAB — BASIC METABOLIC PANEL WITH GFR
BUN: 15 mg/dL (ref 6–23)
CALCIUM: 9.6 mg/dL (ref 8.4–10.5)
CO2: 27 mEq/L (ref 19–32)
Chloride: 100 mEq/L (ref 96–112)
Creat: 0.88 mg/dL (ref 0.50–1.35)
GFR, Est African American: >89 mL/min
Glucose, Bld: 91 mg/dL (ref 70–99)
POTASSIUM: 4.1 meq/L (ref 3.5–5.3)
SODIUM: 137 meq/L (ref 135–145)

## 2013-11-22 LAB — HEPATIC FUNCTION PANEL
ALT: 24 U/L (ref 0–53)
AST: 21 U/L (ref 0–37)
Albumin: 4.4 g/dL (ref 3.5–5.2)
Alkaline Phosphatase: 98 U/L (ref 39–117)
BILIRUBIN DIRECT: 0.1 mg/dL (ref 0.0–0.3)
BILIRUBIN INDIRECT: 0.4 mg/dL (ref 0.2–1.2)
BILIRUBIN TOTAL: 0.5 mg/dL (ref 0.2–1.2)
Total Protein: 7.3 g/dL (ref 6.0–8.3)

## 2013-11-22 MED ORDER — ATORVASTATIN CALCIUM 80 MG PO TABS: 80.0000 mg | ORAL_TABLET | Freq: Every day | ORAL | Status: DC

## 2013-11-22 NOTE — Assessment & Plan Note (Signed)
BP is stable on losartan, continue same, obtain bmet.

## 2013-11-22 NOTE — Assessment & Plan Note (Signed)
Imaging is concerning for transitional cell carcinoma. Advised pt that urology evaluation is very important at this time. Will refer to another urology group within his insurance network.

## 2013-11-22 NOTE — Assessment & Plan Note (Signed)
Lab Results  Component Value Date   CHOL 254* 11/21/2013   HDL 46 11/21/2013   LDLCALC 183* 11/21/2013   TRIG 124 11/21/2013   CHOLHDL 5.5 11/21/2013   Uncontrolled. Increase atorvastatin from 40mg  to 80 mg.

## 2013-11-22 NOTE — Assessment & Plan Note (Signed)
Lab Results  Component Value Date   HGBA1C 6.2* 11/21/2013   A1C remains in borderline dm range. Discussed importance of healthy diet, exercise.

## 2013-11-22 NOTE — Assessment & Plan Note (Signed)
Reports symptoms have improved.  Surgery has been placed on hold pending work up of his bladder mass.

## 2013-11-22 NOTE — Telephone Encounter (Signed)
Pls let pt know that his cholesterol is much higher than it was last time. Increase lipitor from 40mg  to 80 mg repeat flp/lft in 6 weeks, dx hyperlipidemia.

## 2013-11-22 NOTE — Telephone Encounter (Signed)
Left message for pt to return my call.

## 2013-11-23 NOTE — Telephone Encounter (Signed)
Left message with pt's spouse to have pt return my call.

## 2013-11-28 NOTE — Telephone Encounter (Signed)
Mailed letter to pt. Lab order entered.

## 2013-12-21 ENCOUNTER — Encounter: Payer: Self-pay | Admitting: Family

## 2014-01-02 DIAGNOSIS — N3289 Other specified disorders of bladder: Secondary | ICD-10-CM

## 2014-01-02 HISTORY — DX: Other specified disorders of bladder: N32.89

## 2014-01-31 DIAGNOSIS — N3289 Other specified disorders of bladder: Secondary | ICD-10-CM | POA: Insufficient documentation

## 2014-02-06 ENCOUNTER — Other Ambulatory Visit: Payer: Self-pay | Admitting: Family

## 2014-02-08 ENCOUNTER — Telehealth: Payer: Self-pay | Admitting: *Deleted

## 2014-02-08 NOTE — Telephone Encounter (Signed)
Received fax from Dr Lorinda Creed office thanking [place] for our referral. Consultation note was supposed to be included but did not come through. Left message on medical records voicemail requesting consultation note be faxed to [place]. Awaiting information.

## 2014-02-13 NOTE — Telephone Encounter (Signed)
Notes received and forwarded to Provider for review.

## 2014-03-06 ENCOUNTER — Encounter: Payer: Self-pay | Admitting: Family

## 2014-03-11 ENCOUNTER — Other Ambulatory Visit: Payer: Self-pay | Admitting: Physician Assistant

## 2014-03-13 NOTE — Telephone Encounter (Signed)
Rx request to pharmacy/[facility] PATIENT DUE FOR FOLLOW-UP OFFICE VISIT  

## 2014-06-11 ENCOUNTER — Telehealth: Payer: Self-pay | Admitting: Family

## 2014-06-12 NOTE — Telephone Encounter (Signed)
Pt was due for follow up in June and is past due. 2 week supply of losartan sent to pharmacy. Please call pt to arrange appt.

## 2014-06-19 NOTE — Telephone Encounter (Signed)
appt scheduled for pt.  

## 2014-07-03 ENCOUNTER — Ambulatory Visit (INDEPENDENT_AMBULATORY_CARE_PROVIDER_SITE_OTHER): Admitting: Family

## 2014-07-03 ENCOUNTER — Encounter: Payer: Self-pay | Admitting: Family

## 2014-07-03 ENCOUNTER — Ambulatory Visit: Admitting: Family

## 2014-07-03 VITALS — BP 122/86 | HR 81 | Temp 98.3°F | Resp 16 | Ht 74.0 in | Wt 193.0 lb

## 2014-07-03 DIAGNOSIS — R739 Hyperglycemia, unspecified: Secondary | ICD-10-CM

## 2014-07-03 DIAGNOSIS — I10 Essential (primary) hypertension: Secondary | ICD-10-CM

## 2014-07-03 DIAGNOSIS — R7309 Other abnormal glucose: Secondary | ICD-10-CM

## 2014-07-03 DIAGNOSIS — E785 Hyperlipidemia, unspecified: Secondary | ICD-10-CM

## 2014-07-03 DIAGNOSIS — R7303 Prediabetes: Secondary | ICD-10-CM

## 2014-07-03 MED ORDER — LOSARTAN POTASSIUM 50 MG PO TABS: 50.0000 mg | ORAL_TABLET | Freq: Every day | ORAL | Status: DC

## 2014-07-03 NOTE — Assessment & Plan Note (Addendum)
Obtain fasting lipid panel. Continue statin.

## 2014-07-03 NOTE — Assessment & Plan Note (Signed)
He has improved his diet.  Obtain follow up A1C.

## 2014-07-03 NOTE — Progress Notes (Signed)
Pre visit review using our clinic review tool, if applicable. No additional management support is needed unless otherwise documented below in the visit note. 

## 2014-07-03 NOTE — Assessment & Plan Note (Signed)
Stable on losartan. Obtain follow up bmet.

## 2014-07-03 NOTE — Progress Notes (Signed)
Subjective:    Patient ID: Robert Bennett, male    DOB: [birth year], 48 y.o.   MRN: [id number]  HPI   HTN-  Maintained on losartan.   BP Readings from Last 3 Encounters:  07/03/14 122/86  11/21/13 110/86  05/27/13 138/80   Hyperlipidemia-  Lab Results  Component Value Date   CHOL 254* 11/21/2013   HDL 46 11/21/2013   LDLCALC 183* 11/21/2013   TRIG 124 11/21/2013   CHOLHDL 5.5 11/21/2013   Hyperglycemia-   Lab Results  Component Value Date   HGBA1C 6.2* 11/21/2013   Wt Readings from Last 3 Encounters:  07/03/14 193 lb (87.544 kg)  11/21/13 198 lb 0.6 oz (89.83 kg)  05/27/13 200 lb (90.719 kg)  Has been working on healthy diet and eliminated sodas.    Review of Systems    see HPI  Past Medical History  Diagnosis Date  . Hyperlipidemia     Previously on lipitor and simvastatin  . Hypertension     history of elevated BP readings  . GERD (gastroesophageal reflux disease)   . 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  Dr. Estill Dooms    History   Social History  . Marital Status: Married    Spouse Name: N/A    Number of Children: 2  . Years of Education: N/A   Occupational History  . CLAIMS    Social History Main Topics  . Smoking status: Never Smoker   . Smokeless tobacco: Never Used  . Alcohol Use: Yes     Comment: occasional   . Drug Use: No  . Sexual Activity: Not on file   Other Topics Concern  . Not on file   Social History Narrative   Grew up in [place], [place]   Raised by grandparents    Past Surgical History  Procedure Laterality Date  . No past surgeries      Family History  Problem Relation Age of Onset  . Cancer Neg Hx     negative for colon and prostate    No Known Allergies  Current Outpatient Prescriptions on File Prior to Visit  Medication Sig Dispense Refill  . aspirin EC 81 MG tablet Take 1 tablet (81 mg total) by mouth daily.    Marland Kitchen atorvastatin (LIPITOR) 80 MG tablet Take 1 tablet (80 mg total) by mouth  daily. 90 tablet 3  . esomeprazole (NEXIUM) 40 MG capsule Take 40 mg by mouth daily as needed.    Marland Kitchen losartan (COZAAR) 25 MG tablet TAKE 2 TABLETS (50 MG TOTAL) BY MOUTH DAILY. 28 tablet 0  . metroNIDAZOLE (METROGEL) 1 % gel APPLY TOPICALLY 2 (TWO) TIMES DAILY. 60 g 1  . VIAGRA 50 MG tablet TAKE 1 TABLET (50 MG TOTAL) BY MOUTH DAILY AS NEEDED FOR ERECTILE DYSFUNCTION. 10 tablet 4   No current facility-administered medications on file prior to visit.    BP 122/86 mmHg  Pulse 81  Temp(Src) 98.3 F (36.8 C) (Oral)  Resp 16  Ht 6\' 2"  (1.88 m)  Wt 193 lb (87.544 kg)  BMI 24.77 kg/m2  SpO2 99%    Objective:   Physical Exam  Constitutional: He is oriented to person, place, and time. He appears well-developed and well-nourished. No distress.  HENT:  Head: Normocephalic and atraumatic.  Cardiovascular: Normal rate and regular rhythm.   No murmur heard. Pulmonary/Chest: Effort normal and breath sounds normal. No respiratory distress. He has no wheezes. He has no rales. He exhibits no  tenderness.  Musculoskeletal: He exhibits no edema.  Neurological: He is alert and oriented to person, place, and time.  Psychiatric: He has a normal mood and affect. His behavior is normal. Judgment and thought content normal.          Assessment & Plan:  Declines flu shot.

## 2014-07-03 NOTE — Patient Instructions (Signed)
Please return lab work prior to leaving. Follow up in 6 months.

## 2014-07-04 ENCOUNTER — Other Ambulatory Visit (INDEPENDENT_AMBULATORY_CARE_PROVIDER_SITE_OTHER)

## 2014-07-04 DIAGNOSIS — R739 Hyperglycemia, unspecified: Secondary | ICD-10-CM

## 2014-07-04 DIAGNOSIS — E785 Hyperlipidemia, unspecified: Secondary | ICD-10-CM

## 2014-07-04 LAB — BASIC METABOLIC PANEL
BUN: 14 mg/dL (ref 6–23)
CO2: 28 mEq/L (ref 19–32)
Calcium: 9.5 mg/dL (ref 8.4–10.5)
Chloride: 106 mEq/L (ref 96–112)
Creatinine, Ser: 1 mg/dL (ref 0.4–1.5)
GFR: 107.75 mL/min (ref >60.00)
Glucose, Bld: 103 mg/dL — ABNORMAL HIGH (ref 70–99)
POTASSIUM: 4.1 meq/L (ref 3.5–5.1)
SODIUM: 141 meq/L (ref 135–145)

## 2014-07-04 LAB — LIPID PANEL
Cholesterol: 270 mg/dL — ABNORMAL HIGH (ref 0–200)
HDL: 45.8 mg/dL (ref >39.00)
LDL CALC: 208 mg/dL — AB (ref 0–99)
NonHDL: 224.2
Total CHOL/HDL Ratio: 6
Triglycerides: 80 mg/dL (ref 0.0–149.0)
VLDL: 16 mg/dL (ref 0.0–40.0)

## 2014-07-04 LAB — HEMOGLOBIN A1C: HEMOGLOBIN A1C: 6.3 % (ref 4.6–6.5)

## 2014-07-05 ENCOUNTER — Telehealth: Payer: Self-pay | Admitting: Family

## 2014-07-05 DIAGNOSIS — E785 Hyperlipidemia, unspecified: Secondary | ICD-10-CM

## 2014-07-05 NOTE — Telephone Encounter (Signed)
Sugar is stable.  Cholesterol is extremely high. Has he been taking statin?  If not needs to resume and recheck FLP in 6 weeks. If he has been taking statin, then I will need to stop lipitor and start crestor, repeat lipids in 6 weeks.

## 2014-07-05 NOTE — Telephone Encounter (Signed)
Notified pt. He states he had been out of medication for some time. Reports that he will start taking it consistently today. Lab appt scheduled for 08/16/14 at 8:15am. Future order entered.

## 2014-08-16 ENCOUNTER — Ambulatory Visit

## 2014-09-09 IMAGING — CT CT ABD-PELV W/ CM
1 of 3 series · 13 of 32 positions shown, 18 images · IV contrast (OMNIPAQUE 300)
Comparison: None.

CLINICAL DATA: Incarcerated hernia. Evaluate for bowel entrapment.

EXAM:
CT ABDOMEN AND PELVIS WITH CONTRAST
TECHNIQUE: Multidetector CT imaging of the abdomen and pelvis was performed
using the standard protocol following bolus administration of
intravenous contrast.
CONTRAST:  50mL OMNIPAQUE IOHEXOL 300 MG/ML SOLN, 100mL OMNIPAQUE
IOHEXOL 300 MG/ML SOLN

[Series 2: abd/pel with · axial · 0.78mm/px · z∈[-288,+132]mm · 13 of 94 slices shown, 18 images]
[im 5/94  soft-tissue]
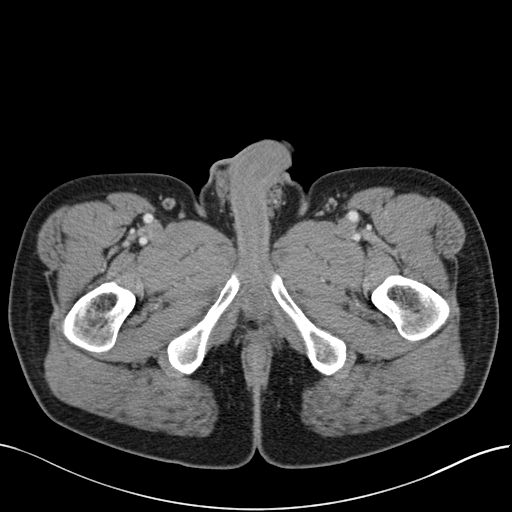
[im 5/94  bone]
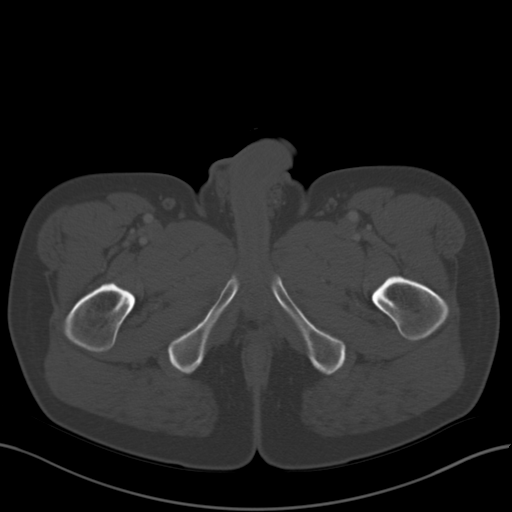
[im 14/94  soft-tissue]
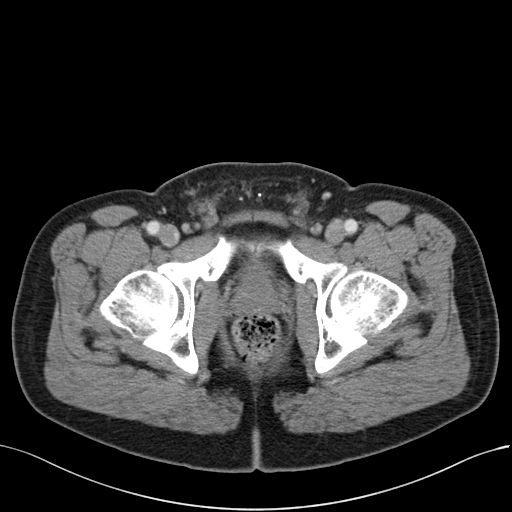
[im 19/94  soft-tissue]
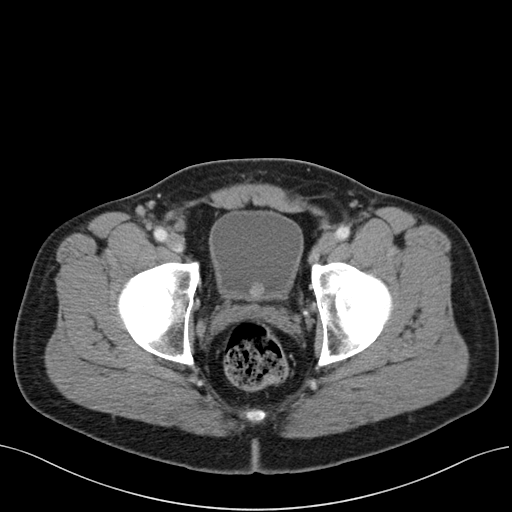
[im 28/94  soft-tissue]
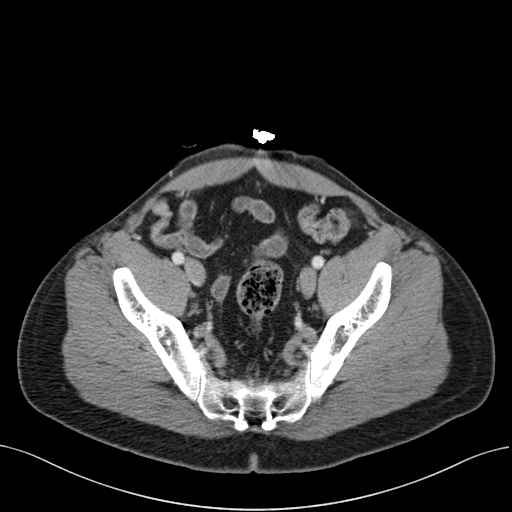
[im 38/94  soft-tissue]
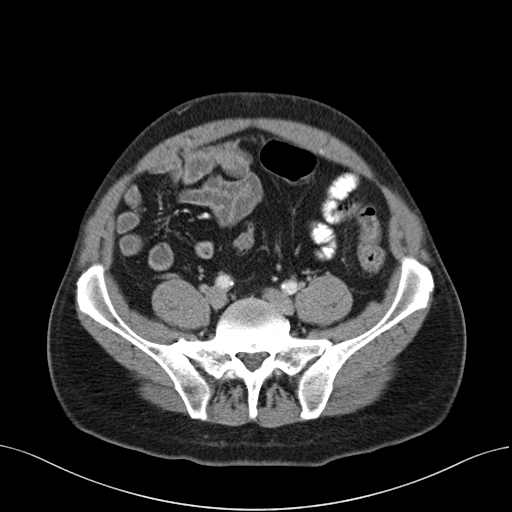
[im 42/94  soft-tissue]
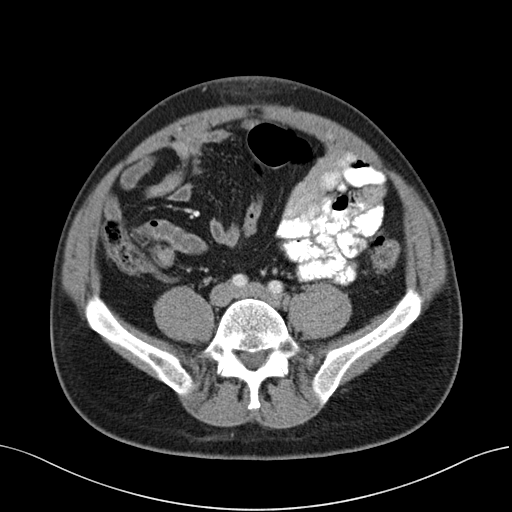
[im 52/94  soft-tissue]
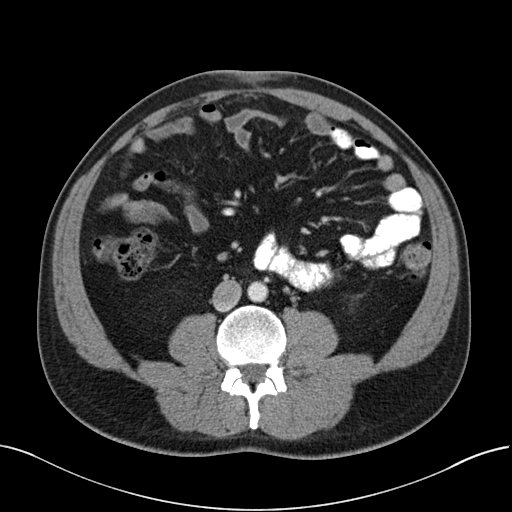
[im 56/94  soft-tissue]
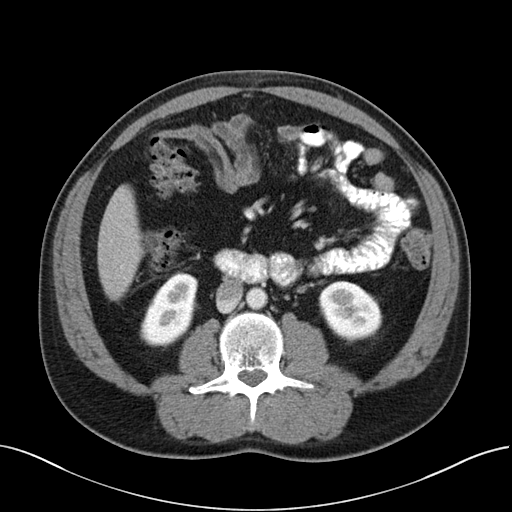
[im 66/94  soft-tissue]
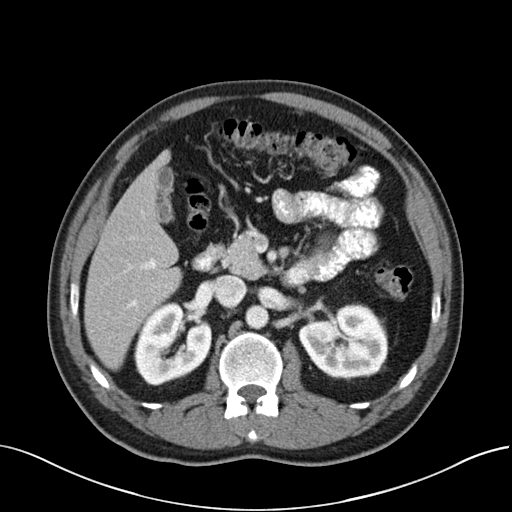
[im 66/94  bone]
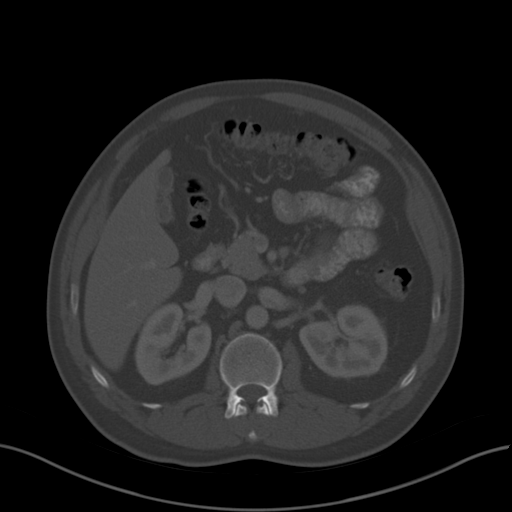
[im 75/94  soft-tissue]
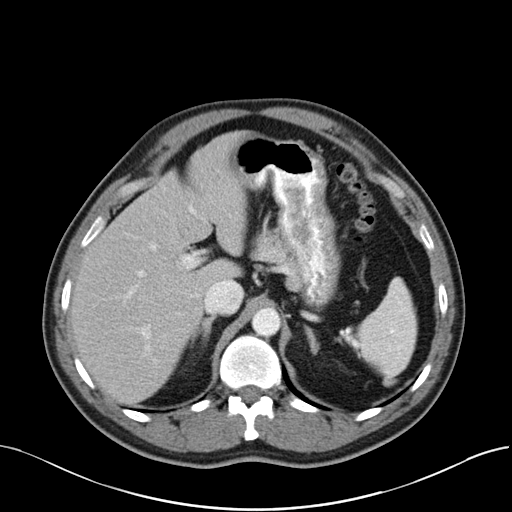
[im 75/94  lung]
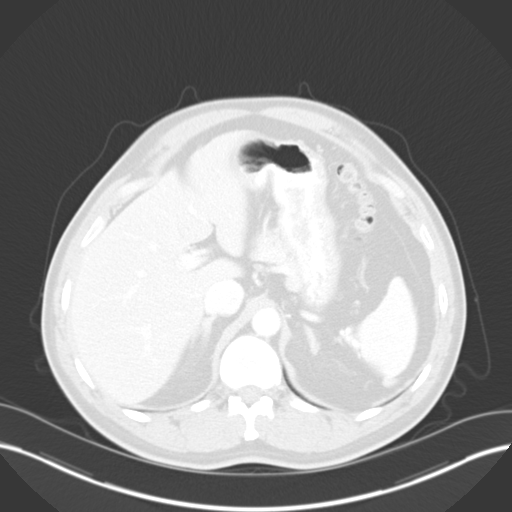
[im 80/94  soft-tissue]
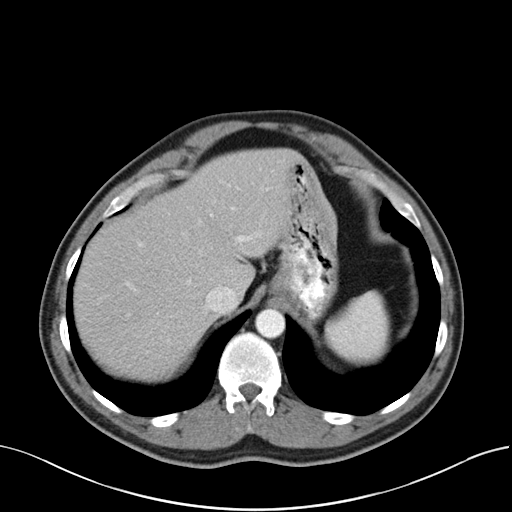
[im 80/94  lung]
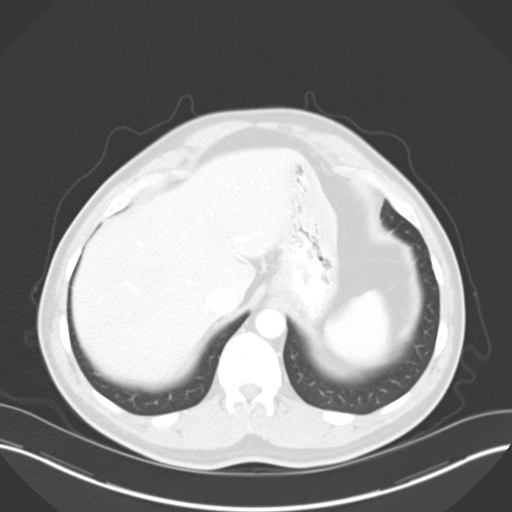
[im 84/94  lung]
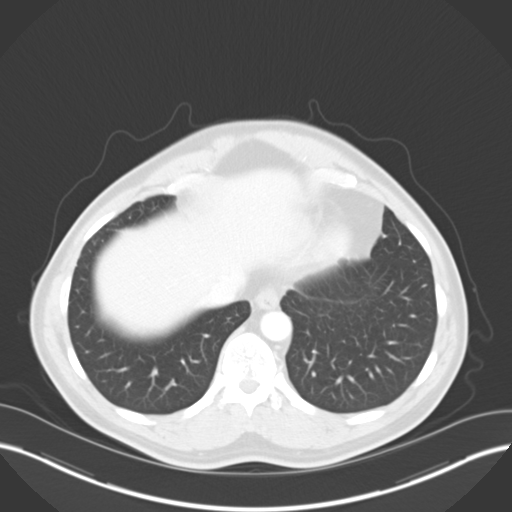
[im 89/94  soft-tissue]
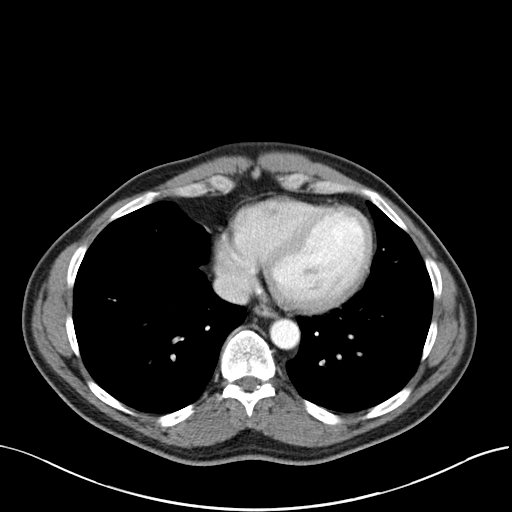
[im 89/94  lung]
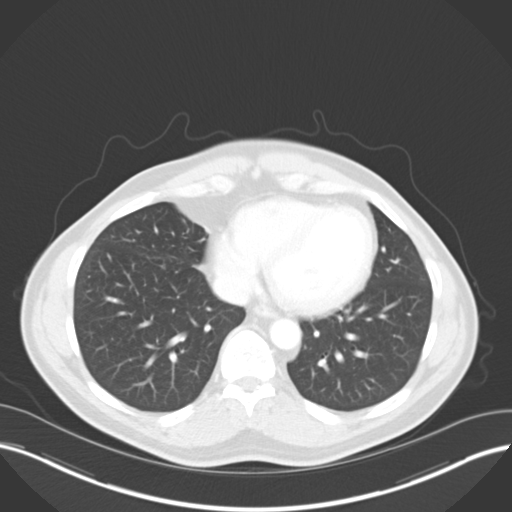

[13 of 32 positions shown; findings below may reference images not displayed]

FINDINGS: Lung Bases: Clear.

Liver:  Normal.

Spleen:  Normal.

Gallbladder:  Contracted. No calcified stones.

Common bile duct:  Normal.

Pancreas:  Normal.

Adrenal glands:  Normal.

Kidneys: Normal enhancement and delayed excretion of contrast. No
ureteral calculi. Nonobstructing 3 mm left upper pole renal
collecting system calculus. Similar calculus in the interpolar
system on the right.

Stomach:  Normal.

Small bowel: Normal duodenum. Proximal small bowel appears normal.
There is no herniated small bowel. No mesenteric adenopathy.

Colon: Normal appendix. No herniated colon. Redundant sigmoid
approaches the periumbilical hernia, without any herniation.

Pelvic Genitourinary: There is an enhancing nodule in the bladder
base measuring 11 mm, suspicious for neoplasm. Followup cystoscopy
is recommended. This is not calcified.

Bones:  No aggressive osseous lesions or fractures.

Vasculature: Minimal atherosclerosis. No acute vascular abnormality.

Body Wall: There is a presumably incarcerated fat containing
periumbilical hernia with marked inflammatory changes in the
anterior wall subcutaneous fat. This probably represents herniated
omentum. There is no bowel wall within the hernia sac although the
anti mesenteric wall of a loop of small bowel does approach the
hernia. No convincing evidence of [place] hernia.
IMPRESSION: 1. Fat containing periumbilical hernia with inflammatory changes
compatible with incarceration. Single loop was small bowel all wall
approaches the hernia but there is no obstruction or evidence of
herniation of the small bowel into the hernia defect.
2. Probable 11 mm transitional cell carcinoma in the inter trigone
portion of the urinary bladder. Followup cystoscopy recommended.
3. Nonobstructing bilateral renal collecting system calculi.

## 2014-12-24 ENCOUNTER — Other Ambulatory Visit: Payer: Self-pay | Admitting: Family

## 2014-12-25 NOTE — Telephone Encounter (Signed)
90 day supply of atorvastatin sent to pharmacy. Pt is due for 6 month follow up on or after 01/01/15.  Please call pt to arrange follow up as additional refills may not be given until he is seen in the office. Thanks!

## 2014-12-26 NOTE — Telephone Encounter (Signed)
LVM advising pt to call back and schedule appointment after 01/01/15.

## 2015-02-13 ENCOUNTER — Other Ambulatory Visit: Payer: Self-pay | Admitting: Family

## 2015-02-13 NOTE — Telephone Encounter (Signed)
Pt last seen 06/2014 and advised 6 month follow up. Pt is past due. 90 day supply of losartan sent to pharmacy but he will need to be seen for further refills.  Please call pt to arrange appt. Thanks!

## 2015-02-14 NOTE — Telephone Encounter (Signed)
Left msg notifying pt he is past due for appt. Advised that 90 day supply of losartan was sent in and he needs appt before additional refills.

## 2015-02-20 ENCOUNTER — Other Ambulatory Visit: Payer: Self-pay | Admitting: Family

## 2015-02-21 NOTE — Telephone Encounter (Signed)
Received atorvastatin request from pharmacy for 90 day supply.  Pt was due for follow up in May and is past due.  Only sent 30 day supply until pt follows up in the office. Message left for pt in May and July that he was due for follow up.  Further requests will be denied until he is seen in the office.  Letter mailed to pt.

## 2015-07-06 ENCOUNTER — Encounter: Payer: Self-pay | Admitting: Family

## 2015-07-06 ENCOUNTER — Ambulatory Visit (INDEPENDENT_AMBULATORY_CARE_PROVIDER_SITE_OTHER): Admitting: Family

## 2015-07-06 VITALS — BP 130/88 | HR 88 | Temp 98.4°F | Resp 16 | Ht 74.0 in | Wt 201.2 lb

## 2015-07-06 DIAGNOSIS — I10 Essential (primary) hypertension: Secondary | ICD-10-CM

## 2015-07-06 DIAGNOSIS — R739 Hyperglycemia, unspecified: Secondary | ICD-10-CM

## 2015-07-06 DIAGNOSIS — E785 Hyperlipidemia, unspecified: Secondary | ICD-10-CM | POA: Diagnosis not present

## 2015-07-06 DIAGNOSIS — L729 Follicular cyst of the skin and subcutaneous tissue, unspecified: Secondary | ICD-10-CM | POA: Diagnosis not present

## 2015-07-06 DIAGNOSIS — N3289 Other specified disorders of bladder: Secondary | ICD-10-CM

## 2015-07-06 DIAGNOSIS — R7303 Prediabetes: Secondary | ICD-10-CM

## 2015-07-06 DIAGNOSIS — M25559 Pain in unspecified hip: Secondary | ICD-10-CM

## 2015-07-06 LAB — LIPID PANEL
CHOLESTEROL: 224 mg/dL — AB (ref 0–200)
HDL: 47.3 mg/dL (ref >39.00)
LDL Cholesterol: 161 mg/dL — ABNORMAL HIGH (ref 0–99)
NonHDL: 176.89
TRIGLYCERIDES: 81 mg/dL (ref 0.0–149.0)
Total CHOL/HDL Ratio: 5
VLDL: 16.2 mg/dL (ref 0.0–40.0)

## 2015-07-06 LAB — BASIC METABOLIC PANEL
BUN: 13 mg/dL (ref 6–23)
CALCIUM: 9.5 mg/dL (ref 8.4–10.5)
CHLORIDE: 105 meq/L (ref 96–112)
CO2: 30 mEq/L (ref 19–32)
CREATININE: 0.94 mg/dL (ref 0.40–1.50)
GFR: 108.64 mL/min (ref >60.00)
Glucose, Bld: 97 mg/dL (ref 70–99)
Potassium: 3.7 mEq/L (ref 3.5–5.1)
Sodium: 142 mEq/L (ref 135–145)

## 2015-07-06 LAB — HEMOGLOBIN A1C: HEMOGLOBIN A1C: 6.1 % (ref 4.6–6.5)

## 2015-07-06 MED ORDER — MELOXICAM 7.5 MG PO TABS: 7.5000 mg | ORAL_TABLET | Freq: Every day | ORAL | Status: DC

## 2015-07-06 NOTE — Assessment & Plan Note (Signed)
Obtain follow up flp, continue statin.  

## 2015-07-06 NOTE — Patient Instructions (Signed)
Please complete lab work prior to leaving.  Continue current meds.  

## 2015-07-06 NOTE — Progress Notes (Signed)
Subjective:    Patient ID: Robert Bennett, male    DOB: 09-23-1965, 49 y.o.   MRN: [id number]  HPI  Robert Bennett is a 49 yr old male who present today with chief complaint of right hip pain.  Reports pain has been present x 1week.  Pain is worse after prolonged sitting. Does not happen every day, denies injury.   Cyst-reports + cyst on the back of his head. Denies pan or drainage.  Noted about 4 months ago.   Hyperlipidemia- reports + compliance with statin.  Review of Systems See HPI  Past Medical History  Diagnosis Date  . Hyperlipidemia     Previously on lipitor and simvastatin  . Hypertension     history of elevated BP readings  . GERD (gastroesophageal reflux disease)   . 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  Dr. Estill Dooms    Social History   Social History  . Marital Status: Married    Spouse Name: N/A  . Number of Children: 2  . Years of Education: N/A   Occupational History  . CLAIMS    Social History Main Topics  . Smoking status: Never Smoker   . Smokeless tobacco: Never Used  . Alcohol Use: Yes     Comment: occasional   . Drug Use: No  . Sexual Activity: Not on file   Other Topics Concern  . Not on file   Social History Narrative   Grew up in [place], [place]   Raised by grandparents    Past Surgical History  Procedure Laterality Date  . No past surgeries      Family History  Problem Relation Age of Onset  . Cancer Neg Hx     negative for colon and prostate    No Known Allergies  Current Outpatient Prescriptions on File Prior to Visit  Medication Sig Dispense Refill  . aspirin EC 81 MG tablet Take 1 tablet (81 mg total) by mouth daily.    Marland Kitchen atorvastatin (LIPITOR) 80 MG tablet TAKE 1 TABLET (80 MG TOTAL) BY MOUTH DAILY. 30 tablet 0  . esomeprazole (NEXIUM) 40 MG capsule Take 40 mg by mouth daily as needed.    Marland Kitchen losartan (COZAAR) 50 MG tablet TAKE 1 TABLET (50 MG TOTAL) BY MOUTH DAILY. 90 tablet 0  . VIAGRA 50 MG tablet TAKE 1  TABLET (50 MG TOTAL) BY MOUTH DAILY AS NEEDED FOR ERECTILE DYSFUNCTION. 10 tablet 4   No current facility-administered medications on file prior to visit.    Pulse 88  Temp(Src) 98.4 F (36.9 C) (Oral)  Resp 16  Ht 6\' 2"  (1.88 m)  Wt 201 lb 3.2 oz (91.264 kg)  BMI 25.82 kg/m2  SpO2 100%       Objective:   Physical Exam  Constitutional: He is oriented to person, place, and time. He appears well-developed and well-nourished. No distress.  HENT:  Head: Normocephalic and atraumatic.  1 cm, soft cystic mass noted left occipital scalp- non-tender  Cardiovascular: Normal rate and regular rhythm.   No murmur heard. Pulmonary/Chest: Effort normal and breath sounds normal. No respiratory distress. He has no wheezes. He has no rales.  Musculoskeletal: He exhibits no edema.  Neurological: He is alert and oriented to person, place, and time.  Skin: Skin is warm and dry.  Psychiatric: He has a normal mood and affect. His behavior is normal. Thought content normal.          Assessment & Plan:  Scalp  cyst- benign appearing, no sign of infection. Advised pt to monitor.   Right hip pain- trial of short course of meloxicam, consider sports med referral if symptoms worsen or do not improve.

## 2015-07-06 NOTE — Progress Notes (Signed)
Pre visit review using our clinic review tool, if applicable. No additional management support is needed unless otherwise documented below in the visit note. 

## 2015-07-06 NOTE — Assessment & Plan Note (Signed)
Obtain follow up A1C.   

## 2015-07-06 NOTE — Assessment & Plan Note (Signed)
BP stable on current meds. Continue same.  

## 2015-07-08 ENCOUNTER — Telehealth: Payer: Self-pay | Admitting: Family

## 2015-07-08 DIAGNOSIS — E785 Hyperlipidemia, unspecified: Secondary | ICD-10-CM

## 2015-07-08 MED ORDER — ROSUVASTATIN CALCIUM 20 MG PO TABS: 20.0000 mg | ORAL_TABLET | Freq: Every day | ORAL | Status: DC

## 2015-07-08 NOTE — Telephone Encounter (Signed)
Cholesterol looks better than last year but still above goal.  D/c atorvastatin, start crestor, obtain flp in 6 weeks, dx hyperlipidemia.

## 2015-07-10 NOTE — Telephone Encounter (Signed)
Notified pt he voices understanding. Lab appt scheduled for 08/21/15 at 9:15am and lab order entered.

## 2015-08-21 ENCOUNTER — Other Ambulatory Visit (INDEPENDENT_AMBULATORY_CARE_PROVIDER_SITE_OTHER)

## 2015-08-21 ENCOUNTER — Telehealth: Payer: Self-pay | Admitting: Family

## 2015-08-21 DIAGNOSIS — E785 Hyperlipidemia, unspecified: Secondary | ICD-10-CM

## 2015-08-21 LAB — LIPID PANEL
CHOLESTEROL: 316 mg/dL — AB (ref 0–200)
HDL: 52.3 mg/dL (ref >39.00)
LDL CALC: 245 mg/dL — AB (ref 0–99)
NONHDL: 263.8
Total CHOL/HDL Ratio: 6
Triglycerides: 93 mg/dL (ref 0.0–149.0)
VLDL: 18.6 mg/dL (ref 0.0–40.0)

## 2015-08-21 NOTE — Telephone Encounter (Signed)
Left message on cell requesting return call. Reviewed lipids.  Lipids very high.  Is he taking his crestor?  If not needs to resume. If he is taking we need to increase to 40mg  and repeat FLP in 2 months.

## 2015-08-21 NOTE — Telephone Encounter (Signed)
Notified pt and he states he has only been taking 1 every 3 days and will re-start daily dose.

## 2016-04-01 ENCOUNTER — Telehealth: Payer: Self-pay | Admitting: Family

## 2016-04-01 MED ORDER — LOSARTAN POTASSIUM 50 MG PO TABS: ORAL_TABLET | ORAL | 0 refills | Status: DC

## 2016-04-01 MED ORDER — ROSUVASTATIN CALCIUM 20 MG PO TABS: 20.0000 mg | ORAL_TABLET | Freq: Every day | ORAL | 0 refills | Status: DC

## 2016-04-01 NOTE — Telephone Encounter (Signed)
Ok to send 30 day supply but he needs blood pressure recheck and repeat labs in the office please.

## 2016-04-01 NOTE — Telephone Encounter (Signed)
Melissa-- please advise? 

## 2016-04-01 NOTE — Telephone Encounter (Signed)
Spoke with pt and verified that he is taking Crestor for cholesterol and not atorvastatin. 30 day supply sent of each below and scheduled pt for follow up on 04/22/16 at 8:30am as he states he has been out of both medications for a while.

## 2016-04-01 NOTE — Telephone Encounter (Signed)
°  Relation to [id number]  Pharmacy: [facility] [id number] [place], [place] [address] [phone]    Reason for call:  Patient requesting a refill  atorvastatin (LIPITOR) 40 MG tablet and losartan (COZAAR) 50 MG tablet. Patient states he's aware he hasnt been seen since 2016 but labs were taken and that should be sufficient enough. Please advise

## 2016-04-02 ENCOUNTER — Telehealth: Payer: Self-pay | Admitting: *Deleted

## 2016-04-02 MED ORDER — LOSARTAN POTASSIUM 50 MG PO TABS: ORAL_TABLET | ORAL | 0 refills | Status: DC

## 2016-04-02 MED ORDER — ROSUVASTATIN CALCIUM 20 MG PO TABS: 20.0000 mg | ORAL_TABLET | Freq: Every day | ORAL | 0 refills | Status: DC

## 2016-04-02 NOTE — Telephone Encounter (Signed)
Rxs cancelled at [facility] per [facility] and sent to [facility].

## 2016-04-08 ENCOUNTER — Encounter: Payer: Self-pay | Admitting: Family

## 2016-04-08 ENCOUNTER — Ambulatory Visit (INDEPENDENT_AMBULATORY_CARE_PROVIDER_SITE_OTHER): Admitting: Family

## 2016-04-08 ENCOUNTER — Telehealth: Payer: Self-pay | Admitting: Family

## 2016-04-08 VITALS — BP 153/99 | HR 92 | Temp 98.7°F | Resp 17 | Ht 74.0 in | Wt 201.2 lb

## 2016-04-08 DIAGNOSIS — R1013 Epigastric pain: Secondary | ICD-10-CM

## 2016-04-08 DIAGNOSIS — N529 Male erectile dysfunction, unspecified: Secondary | ICD-10-CM | POA: Diagnosis not present

## 2016-04-08 DIAGNOSIS — R7303 Prediabetes: Secondary | ICD-10-CM | POA: Diagnosis not present

## 2016-04-08 DIAGNOSIS — E785 Hyperlipidemia, unspecified: Secondary | ICD-10-CM

## 2016-04-08 DIAGNOSIS — I10 Essential (primary) hypertension: Secondary | ICD-10-CM | POA: Diagnosis not present

## 2016-04-08 DIAGNOSIS — G459 Transient cerebral ischemic attack, unspecified: Secondary | ICD-10-CM

## 2016-04-08 MED ORDER — ROSUVASTATIN CALCIUM 20 MG PO TABS: 20.0000 mg | ORAL_TABLET | Freq: Every day | ORAL | 5 refills | Status: DC

## 2016-04-08 MED ORDER — LOSARTAN POTASSIUM 50 MG PO TABS: ORAL_TABLET | ORAL | 5 refills | Status: DC

## 2016-04-08 NOTE — Patient Instructions (Signed)
Please restart medication.

## 2016-04-08 NOTE — Telephone Encounter (Signed)
Patient Name: Robert Bennett  DOB: 10-Sep-1965    Initial Comment Caller states husband c/o elevated blood pressure over the last few days.   Nurse Assessment  Nurse: Leilani Merl, RN, Heather Date/Time (Eastern Time): 04/08/2016 11:29:23 AM  Confirm and document reason for call. If symptomatic, describe symptoms. You must click the next button to save text entered. ---Caller states husband c/o elevated blood pressure over the last few days., they checked his BP and the top number was in the 150's and the bottom number in the 130's. Caller states that she thinks that the blood pressure cuff may not be working correctly.  Has the patient traveled out of the country within the last 30 days? ---Not Applicable  Does the patient have any new or worsening symptoms? ---Yes  Will a triage be completed? ---Yes  Related visit to physician within the last 2 weeks? ---No  Does the PT have any chronic conditions? (i.e. diabetes, asthma, etc.) ---Yes  List chronic conditions. ---See MR  Is this a behavioral health or substance abuse call? ---No     Guidelines    Guideline Title Affirmed Question Affirmed Notes  High Blood Pressure [1] BP ? 200/120 AND [2] having NO cardiac or neurologic symptoms    Final Disposition User   See Physician within 4 Hours (or PCP triage) Leilani Merl, RN, Heather    Comments  Appt with Dr. Inda Castle for 230 today.   Referrals  REFERRED TO PCP OFFICE   Disagree/Comply: Comply

## 2016-04-08 NOTE — Telephone Encounter (Signed)
Patient seen today in office

## 2016-04-08 NOTE — Progress Notes (Signed)
Pre visit review using our clinic review tool, if applicable. No additional management support is needed unless otherwise documented below in the visit note. 

## 2016-04-08 NOTE — Assessment & Plan Note (Signed)
Continue viagra prn.

## 2016-04-08 NOTE — Assessment & Plan Note (Signed)
Restart aspirin

## 2016-04-08 NOTE — Assessment & Plan Note (Signed)
Resolved. Ok to remain off of nexium.

## 2016-04-08 NOTE — Telephone Encounter (Signed)
Appointment noted.  Message routed to [place] for [place].

## 2016-04-08 NOTE — Assessment & Plan Note (Signed)
Obtain follow up A1C.   

## 2016-04-08 NOTE — Progress Notes (Signed)
Subjective:    Patient ID: Robert Bennett, male    DOB: Feb 17, 1966, 50 y.o.   MRN: [id number]  HPI   Robert Bennett is a 50 yr old male who presents today for follow up.  1) HTN- reports that he has run out of medication.   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    3)ED- Maintained on viagra prn. Rarely needing to use.   5) Hx of TIA- not taking aspirin.   6) Hyperlipidemia-  Lab Results  Component Value Date   CHOL 316 (H) 08/21/2015   HDL 52.30 08/21/2015   LDLCALC 245 (H) 08/21/2015   TRIG 93.0 08/21/2015   CHOLHDL 6 08/21/2015   GERD- stable off of nexium.     Review of Systems See HPI  Past Medical History:  Diagnosis Date  . 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  Dr. Estill Bennett  . GERD (gastroesophageal reflux disease)   . Hyperlipidemia    Previously on lipitor and simvastatin  . Hypertension    history of elevated BP readings     Social History   Social History  . Marital status: Married    Spouse name: N/A  . Number of children: 2  . Years of education: N/A   Occupational History  . [place] History Main Topics  . Smoking status: Never Smoker  . Smokeless tobacco: Never Used  . Alcohol use Yes     Comment: occasional   . Drug use: No  . Sexual activity: Not on file   Other Topics Concern  . Not on file   Social History Narrative   Grew up in [place], [place]   Raised by grandparents    Past Surgical History:  Procedure Laterality Date  . NO PAST SURGERIES      Family History  Problem Relation Age of Onset  . Cancer Neg Hx     negative for colon and prostate    No Known Allergies  Current Outpatient Prescriptions on File Prior to Visit  Medication Sig Dispense Refill  . aspirin EC 81 MG tablet Take 1 tablet (81 mg total) by mouth daily.    . finasteride (PROSCAR) 5 MG tablet  Take 1/2 tablet by mouth three times a week.    Marland Kitchen VIAGRA 50 MG tablet TAKE 1 TABLET (50 MG TOTAL) BY MOUTH DAILY AS NEEDED FOR ERECTILE DYSFUNCTION. 10 tablet 4   No current facility-administered medications on file prior to visit.     BP (!) 153/99 (BP Location: Right Arm, Patient Position: Sitting, Cuff Size: Normal)   Pulse 92   Temp 98.7 F (37.1 C) (Oral)   Resp 17   Ht 6\' 2"  (1.88 m)   Wt 201 lb 3.2 oz (91.3 kg)   SpO2 98%   BMI 25.83 kg/m       Objective:   Physical Exam  Constitutional: He is oriented to person, place, and time. He appears well-developed and well-nourished. No distress.  HENT:  Head: Normocephalic and atraumatic.  Cardiovascular: Normal rate and regular rhythm.   No murmur heard. Pulmonary/Chest: Effort normal and breath sounds normal. No respiratory distress. He has no wheezes. He has no rales.  Musculoskeletal: He exhibits no edema.  Neurological: He is alert and oriented to person, place, and time.  Skin: Skin is warm and dry.  Psychiatric:  He has a normal mood and affect. His behavior is normal. Thought content normal.          Assessment & Plan:

## 2016-04-08 NOTE — Assessment & Plan Note (Signed)
Uncontrolled, resume losartan, obtain follow up bmet to assess kidney function.

## 2016-04-08 NOTE — Assessment & Plan Note (Signed)
Uncontrolled. Advised pt to restart statin.

## 2016-04-09 LAB — BASIC METABOLIC PANEL
BUN: 15 mg/dL (ref 6–23)
CHLORIDE: 106 meq/L (ref 96–112)
CO2: 27 mEq/L (ref 19–32)
Calcium: 9.4 mg/dL (ref 8.4–10.5)
Creatinine, Ser: 0.94 mg/dL (ref 0.40–1.50)
GFR: 108.32 mL/min (ref >60.00)
Glucose, Bld: 94 mg/dL (ref 70–99)
POTASSIUM: 3.7 meq/L (ref 3.5–5.1)
Sodium: 140 mEq/L (ref 135–145)

## 2016-04-09 LAB — HEMOGLOBIN A1C: HEMOGLOBIN A1C: 6 % (ref 4.6–6.5)

## 2016-04-10 ENCOUNTER — Encounter: Payer: Self-pay | Admitting: Family

## 2016-04-22 ENCOUNTER — Ambulatory Visit: Admitting: Family

## 2016-06-08 ENCOUNTER — Other Ambulatory Visit: Payer: Self-pay | Admitting: Family

## 2016-09-15 ENCOUNTER — Telehealth: Payer: Self-pay | Admitting: Family

## 2016-09-15 NOTE — Telephone Encounter (Signed)
Pt dropped off disability paperwork to be filled out, documents placed in tray at front office, pt states he will pick up when ready

## 2016-09-23 NOTE — Telephone Encounter (Signed)
LVM for pt to pick up paperwork (paperwork ready)

## 2016-09-26 ENCOUNTER — Encounter: Payer: Self-pay | Admitting: Family

## 2016-09-26 ENCOUNTER — Ambulatory Visit (INDEPENDENT_AMBULATORY_CARE_PROVIDER_SITE_OTHER): Admitting: Family

## 2016-09-26 VITALS — BP 122/83 | HR 90 | Temp 98.4°F | Resp 16 | Ht 76.0 in | Wt 205.0 lb

## 2016-09-26 DIAGNOSIS — E785 Hyperlipidemia, unspecified: Secondary | ICD-10-CM | POA: Diagnosis not present

## 2016-09-26 DIAGNOSIS — R739 Hyperglycemia, unspecified: Secondary | ICD-10-CM | POA: Diagnosis not present

## 2016-09-26 LAB — BASIC METABOLIC PANEL
BUN: 14 mg/dL (ref 7–25)
CHLORIDE: 106 mmol/L (ref 98–110)
CO2: 26 mmol/L (ref 20–31)
CREATININE: 1.04 mg/dL (ref 0.70–1.33)
Calcium: 9.7 mg/dL (ref 8.6–10.3)
GLUCOSE: 91 mg/dL (ref 65–99)
POTASSIUM: 4 mmol/L (ref 3.5–5.3)
Sodium: 142 mmol/L (ref 135–146)

## 2016-09-26 LAB — LIPID PANEL
Cholesterol: 231 mg/dL — ABNORMAL HIGH (ref <200)
HDL: 49 mg/dL (ref >40)
LDL CALC: 152 mg/dL — AB (ref <100)
TRIGLYCERIDES: 152 mg/dL — AB (ref <150)
Total CHOL/HDL Ratio: 4.7 Ratio (ref <5.0)
VLDL: 30 mg/dL (ref <30)

## 2016-09-26 LAB — HEMOGLOBIN A1C
Hgb A1c MFr Bld: 5.8 % — ABNORMAL HIGH (ref <5.7)
MEAN PLASMA GLUCOSE: 120 mg/dL

## 2016-09-26 MED ORDER — ROSUVASTATIN CALCIUM 20 MG PO TABS: 20.0000 mg | ORAL_TABLET | Freq: Every day | ORAL | 5 refills | Status: DC

## 2016-09-26 MED ORDER — MELOXICAM 7.5 MG PO TABS: 7.5000 mg | ORAL_TABLET | Freq: Every day | ORAL | 0 refills | Status: DC

## 2016-09-26 NOTE — Telephone Encounter (Signed)
Will await the scanned records, this was completed in full earlier this week.

## 2016-09-26 NOTE — Progress Notes (Signed)
Subjective:    Patient ID: Robert Bennett, male    DOB: September 30, 1965, 51 y.o.   MRN: [id number]  HPI   Mr. Robert Bennett is a 51 yr old male who presents today with chief complaint of low back pain.  Pain is worsened by sitting to standing. Reports that his right elbow started hurting about 3 weeks ago.  Pain is sharp and is brief.  Pain is non-radiating, no leg numbness, no bowel/bladder incontinence.    He did not restart aspirin.  Review of Systems See HPI  Past Medical History:  Diagnosis Date  . 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  Dr. Estill Dooms  . GERD (gastroesophageal reflux disease)   . Hyperlipidemia    Previously on lipitor and simvastatin  . Hypertension    history of elevated BP readings     Social History   Social History  . Marital status: Married    Spouse name: N/A  . Number of children: 2  . Years of education: N/A   Occupational History  . [place] History Main Topics  . Smoking status: Never Smoker  . Smokeless tobacco: Never Used  . Alcohol use Yes     Comment: occasional   . Drug use: No  . Sexual activity: Not on file   Other Topics Concern  . Not on file   Social History Narrative   Grew up in [place], [place]   Raised by grandparents    Past Surgical History:  Procedure Laterality Date  . NO PAST SURGERIES      Family History  Problem Relation Age of Onset  . Cancer Neg Hx     negative for colon and prostate    No Known Allergies  Current Outpatient Prescriptions on File Prior to Visit  Medication Sig Dispense Refill  . aspirin EC 81 MG tablet Take 1 tablet (81 mg total) by mouth daily.    . finasteride (PROSCAR) 5 MG tablet Take 1/2 tablet by mouth three times a week.    . losartan (COZAAR) 50 MG tablet TAKE 1 TABLET (50 MG TOTAL) BY MOUTH DAILY. 30 tablet 5  . rosuvastatin (CRESTOR) 20 MG tablet Take 1 tablet (20 mg total) by mouth daily. 30 tablet 5  . rosuvastatin (CRESTOR) 20 MG tablet TAKE 1  TABLET BY MOUTH EVERY DAY 30 tablet 0  . VIAGRA 50 MG tablet TAKE 1 TABLET (50 MG TOTAL) BY MOUTH DAILY AS NEEDED FOR ERECTILE DYSFUNCTION. 10 tablet 4   No current facility-administered medications on file prior to visit.     BP 122/83 (BP Location: Left Arm, Patient Position: Sitting, Cuff Size: Large)   Pulse 90   Temp 98.4 F (36.9 C) (Oral)   Resp 16   Ht 6\' 4"  (1.93 m)   Wt 205 lb (93 kg)   SpO2 98%   BMI 24.95 kg/m       Objective:   Physical Exam  Constitutional: He is oriented to person, place, and time. He appears well-developed and well-nourished. No distress.  HENT:  Head: Normocephalic and atraumatic.  Cardiovascular: Normal rate and regular rhythm.   No murmur heard. Pulmonary/Chest: Effort normal and breath sounds normal. No respiratory distress. He has no wheezes. He has no rales.  Musculoskeletal: He exhibits no edema.  Right elbow without swelling LE strength is 5/5  Neurological: He is alert and oriented to person, place, and time.  Reflex Scores:      Patellar reflexes  are 2+ on the right side and 2+ on the left side. Skin: Skin is warm and dry.  Psychiatric: He has a normal mood and affect. His behavior is normal. Thought content normal.          Assessment & Plan:  Back pain/elbow pain- trial of meloxicam.  Advised pt to add aspirin 81mg  once daily for cardiovascular risk reduction.   Hyperglycemia- follow up A1C is stable.  Lab Results  Component Value Date   HGBA1C 5.8 (H) 09/26/2016   Hyperlipidemia- Above goal. Need to verify that patient is taking crestor.  Lab Results  Component Value Date   CHOL 231 (H) 09/26/2016   HDL 49 09/26/2016   LDLCALC 152 (H) 09/26/2016   TRIG 152 (H) 09/26/2016   CHOLHDL 4.7 09/26/2016

## 2016-09-26 NOTE — Progress Notes (Signed)
Pre visit review using our clinic review tool, if applicable. No additional management support is needed unless otherwise documented below in the visit note. 

## 2016-09-26 NOTE — Patient Instructions (Signed)
Please complete lab work prior to leaving. Continue current medications. Add aspirin 81mg  once daily. Add meloxicam 7.5mg  once daily (anti-inflammatory).  Let me know if your back/elbow pain worsens or if it does not improve.

## 2016-09-26 NOTE — Telephone Encounter (Signed)
Pt seen in office today. Brought paperwork back in to the office from the Dept. [place] for PCP signature. Previous forms not scanned into chart yet. Placed new forms in PCP red folder.

## 2016-09-29 NOTE — Telephone Encounter (Signed)
Robert Bennett or Robert Bennett-- have either of you seen these forms?

## 2016-10-01 ENCOUNTER — Telehealth: Payer: Self-pay

## 2016-10-01 DIAGNOSIS — E785 Hyperlipidemia, unspecified: Secondary | ICD-10-CM

## 2016-10-01 MED ORDER — ROSUVASTATIN CALCIUM 40 MG PO TABS: 40.0000 mg | ORAL_TABLET | Freq: Every day | ORAL | 1 refills | Status: AC

## 2016-10-01 MED ORDER — ROSUVASTATIN CALCIUM 20 MG PO TABS: 40.0000 mg | ORAL_TABLET | Freq: Every day | ORAL | 5 refills | Status: DC

## 2016-10-01 NOTE — Telephone Encounter (Signed)
See note below.//AB/CMA

## 2016-10-01 NOTE — Telephone Encounter (Signed)
-----   Message from Debbrah Alar, NP sent at 09/28/2016 11:40 AM EST ----- Kidney function is normal.  Cholesterol looks better than it was last year but still above goal.  Is he taking crestor daily?  If so, we need to increase from 20mg  to 40mg  and repeat lipid panel in 1 month.

## 2016-10-01 NOTE — Telephone Encounter (Signed)
Spoke with pt about results notified pt that Crestor 20mg  will be changed to Crestor 40mg  sent rx to pharmacy. Pt. Voiced understanding .Pt stated he would call back to schedule month lab appointment.  Future lab order entered

## 2016-10-07 ENCOUNTER — Telehealth: Payer: Self-pay | Admitting: *Deleted

## 2016-10-07 NOTE — Telephone Encounter (Signed)
Received call from Freda Munro at the [place] requesting pt's most recent labs be faxed to them at [phone]. Results faxed from 09/2016 visit.

## 2016-10-07 NOTE — Telephone Encounter (Signed)
Original paperwork now scanned into pt's record. Previous forms printed and placed with new form for PCP to review / complete.  Please advise when completed?

## 2016-10-09 NOTE — Telephone Encounter (Signed)
Form completed.

## 2016-10-15 NOTE — Telephone Encounter (Signed)
Patient states he has not started new medication and will call back to schedule lab appointment in a month.

## 2017-01-10 ENCOUNTER — Other Ambulatory Visit: Payer: Self-pay | Admitting: Family

## 2017-02-12 ENCOUNTER — Other Ambulatory Visit: Payer: Self-pay | Admitting: Family

## 2017-03-02 DIAGNOSIS — I214 Non-ST elevation (NSTEMI) myocardial infarction: Secondary | ICD-10-CM | POA: Insufficient documentation

## 2017-03-22 ENCOUNTER — Telehealth: Payer: Self-pay | Admitting: Family

## 2017-03-22 NOTE — Telephone Encounter (Signed)
Please contact patient to arrange TCM follow-up. 

## 2017-03-24 NOTE — Telephone Encounter (Signed)
Unable to reach patient for TCM/Hospital Follow-up call at this time. Left message for patient to return call when available.

## 2017-03-26 NOTE — Telephone Encounter (Signed)
Caller name: Travell Desaulniers Relationship to patient: wife Can be reached: [phone]  Reason for call: calling back about TCM call

## 2017-03-26 NOTE — Telephone Encounter (Signed)
Transition Care Management Follow-up Telephone Call   Date discharged? 03/20/17   How have you been since you were released from the hospital? Patient stated, "pretty good".   Do you understand why you were in the hospital? yes, patient voiced that he had a heart attack.   Do you understand the discharge instructions? yes   Where were you discharged to? Home   Items Reviewed:  Medications reviewed: yes  Allergies reviewed: yes, NKA  Dietary changes reviewed: yes, low-sodium, heart healthy diet  Referrals reviewed: yes, follow-up with PCP, Cardiology & Rehab   Functional Questionnaire:   Activities of Daily Living (ADLs):   He states they are independent in the following: ambulation, bathing and hygiene, feeding, continence, grooming, toileting and dressing States they require assistance with the following: None   Any transportation issues/concerns?: no   Any patient concerns? no   Confirmed importance and date/time of follow-up visits scheduled yes, 03/30/17 at 9:00 AM.  Provider Appointment booked with Debbrah Alar, NP.  Confirmed with patient if condition begins to worsen call PCP or go to the ER.  Patient was given the office number and encouraged to call back with question or concerns.  : yes

## 2017-03-30 ENCOUNTER — Encounter: Payer: Self-pay | Admitting: Family

## 2017-03-30 ENCOUNTER — Ambulatory Visit (INDEPENDENT_AMBULATORY_CARE_PROVIDER_SITE_OTHER): Admitting: Family

## 2017-03-30 ENCOUNTER — Telehealth: Payer: Self-pay | Admitting: Family

## 2017-03-30 VITALS — BP 92/62 | HR 120 | Temp 98.6°F | Resp 18 | Ht 76.0 in | Wt 180.0 lb

## 2017-03-30 DIAGNOSIS — I82621 Acute embolism and thrombosis of deep veins of right upper extremity: Secondary | ICD-10-CM

## 2017-03-30 DIAGNOSIS — I2510 Atherosclerotic heart disease of native coronary artery without angina pectoris: Secondary | ICD-10-CM

## 2017-03-30 DIAGNOSIS — I10 Essential (primary) hypertension: Secondary | ICD-10-CM | POA: Diagnosis not present

## 2017-03-30 DIAGNOSIS — E875 Hyperkalemia: Secondary | ICD-10-CM | POA: Diagnosis not present

## 2017-03-30 DIAGNOSIS — I509 Heart failure, unspecified: Secondary | ICD-10-CM

## 2017-03-30 DIAGNOSIS — I82409 Acute embolism and thrombosis of unspecified deep veins of unspecified lower extremity: Secondary | ICD-10-CM | POA: Insufficient documentation

## 2017-03-30 LAB — BASIC METABOLIC PANEL
BUN: 24 mg/dL — AB (ref 6–23)
CHLORIDE: 98 meq/L (ref 96–112)
CO2: 27 mEq/L (ref 19–32)
Calcium: 10.4 mg/dL (ref 8.4–10.5)
Creatinine, Ser: 1.11 mg/dL (ref 0.40–1.50)
GFR: 89.07 mL/min (ref >60.00)
GLUCOSE: 131 mg/dL — AB (ref 70–99)
POTASSIUM: 5.2 meq/L — AB (ref 3.5–5.1)
SODIUM: 132 meq/L — AB (ref 135–145)

## 2017-03-30 NOTE — Telephone Encounter (Signed)
Faxed request to [facility] med records at [phone]. Awaiting results.

## 2017-03-30 NOTE — Assessment & Plan Note (Signed)
Will need anticoagulation x 3 months with eliquis.  I am concerned that his is a very high bleed risk on eliquis, plavix and aspirin.

## 2017-03-30 NOTE — Assessment & Plan Note (Signed)
On decreased dose of lasix. BP is better today.  Advised pt to monitor and report weights as follows:  Call Provider: Anytime you have any of the following symptoms: 1) 3 pound weight gain in 24 hours or 5 pounds in 1 week 2) shortness of breath, with or without a dry hacking cough 3) swelling in the hands, feet or stomach 4) if you have to sleep on extra pillows at night in order to breathe.

## 2017-03-30 NOTE — Assessment & Plan Note (Signed)
Blood pressure looks better today on decreased dose of Lasix. Will monitor.

## 2017-03-30 NOTE — Progress Notes (Signed)
Subjective:    Patient ID: Robert Bennett, male    DOB: [birth year], 51 y.o.   MRN: [id number]  HPI   Robert Bennett is a 51 year old male who presents today for hospital follow-up. Reviewed records in [place]. Patient presented to the emergency department initially on 02/28/17.  Woke up with sweating tingling down the left arm. Took some aspirin, went to the ED. patient ruled in for non-ST elevation myocardial infarction. He had a cardiac catheterization performed on 03/03/2017 which showed multi-vessel coronary artery disease. He was noted to have an ejection fraction of 40% on echo. He was taken to the operating room on 03/04/2017. In the operating room just prior to undergoing bypass surgery the patient experienced an episode of ventricular fibrillation was defibrillated. He subsequently underwent coronary bypass grafting 3. He had difficulty weaning off of the bypass and an intra-aortic balloon pump was placed. This did not provide enough support and an Impella was then placed. They attempted to close the patient's chest but he developed hypotension. He was ultimately taken from the operating room to the coronary care unit in critical condition on multiple pressors and with his chest open and in [place] in place. Arrangements were made for the patient to be transferred via ambulance to [facility] medical center. During his hospitalization he was also noted to have acute DVT of RIJ, acute superficial thrombus seen in R basilic vein associated with PICC line Normal perfusion of LUE. He was placed on Eliquis for this.  He was discharged home on 03/20/17.   He has been to the emergency department twice since his discharge home from [facility]. His first visit was on August 20th. On this visit he had some altered mental status and was evaluated for delirium. There is no obvious source of his delirium determined during this visit. They recommended that he apply Bactroban ointment to his sternal  wound.  His next visit to the emergency department was on August 26. He presented with chief complaint of dizziness. His wife checked his blood pressure and got the following readings that day 79/49 and 65/53. The ER physician spoke with his cardiologist yesterday and they recommended decreasing his Lasix from 80 mg twice daily to 40 mg twice daily and holding his potassium. Potassium was 5.4 yesterday.  He feels a lot better than he did yesterday in the ER. He is no longer dizzy. He reports that he continues to have some fatigue and lacks stamina. Appetite is slow to come back.   He has a follow up with cardiology on 04/09/17 and CV surgery on 9/13.  He continues to wear his LifeVest.   Review of Systems Past Medical History:  Diagnosis Date  . 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  Dr. Estill Dooms  . GERD (gastroesophageal reflux disease)   . Hyperlipidemia    Previously on lipitor and simvastatin  . Hypertension    history of elevated BP readings     Social History   Social History  . Marital status: Married    Spouse name: N/A  . Number of children: 2  . Years of education: N/A   Occupational History  . [place] History Main Topics  . Smoking status: Never Smoker  . Smokeless tobacco: Never Used  . Alcohol use Yes     Comment: occasional   . Drug use: No  . Sexual activity: Not on file   Other Topics Concern  . Not  on file   Social History Narrative   Grew up in [place], [place]   Raised by grandparents    Past Surgical History:  Procedure Laterality Date  . NO PAST SURGERIES      Family History  Problem Relation Age of Onset  . Cancer Neg Hx        negative for colon and prostate    No Known Allergies  Current Outpatient Prescriptions on File Prior to Visit  Medication Sig Dispense Refill  . aspirin EC 81 MG tablet Take 1 tablet (81 mg total) by mouth daily.    . rosuvastatin (CRESTOR) 40 MG tablet Take 1 tablet (40 mg total)  by mouth daily. 30 tablet 1  . finasteride (PROSCAR) 5 MG tablet Take 1/2 tablet by mouth three times a week.     No current facility-administered medications on file prior to visit.     BP 92/62 (BP Location: Left Arm, Cuff Size: Normal)   Pulse (!) 120   Temp 98.6 F (37 C) (Oral)   Resp 18   Ht 6\' 4"  (1.93 m)   Wt 180 lb (81.6 kg)   SpO2 100%   BMI 21.91 kg/m       Objective:   Physical Exam  Constitutional: He is oriented to person, place, and time. He appears well-developed and well-nourished. No distress.  HENT:  Head: Normocephalic and atraumatic.  Cardiovascular: Normal rate and regular rhythm.   No murmur heard. Pulmonary/Chest: Effort normal and breath sounds normal. No respiratory distress. He has no wheezes. He has no rales.  Musculoskeletal: He exhibits no edema.  Neurological: He is alert and oriented to person, place, and time.  Skin: Skin is warm and dry.  Sternal incision is well approximated and appears to be healing well.  Psychiatric: He has a normal mood and affect. His behavior is normal. Thought content normal.          Assessment & Plan:   Hyperkalemia-check follow-up basic metabolic panel today.

## 2017-03-30 NOTE — Telephone Encounter (Signed)
Could you please call Robert Bennett and requests copies of his upper and lower extremity Doppler? I'm able to see in care everywhere that these were completed but I am unable to view the results.

## 2017-03-30 NOTE — Patient Instructions (Signed)
Call Provider: Anytime you have any of the following symptoms: 1) 3 pound weight gain in 24 hours or 5 pounds in 1 week 2) shortness of breath, with or without a dry hacking cough 3) swelling in the hands, feet or stomach 4) if you have to sleep on extra pillows at night in order to breathe.  Complete lab work prior to leaving.

## 2017-03-30 NOTE — Assessment & Plan Note (Addendum)
After a very complicated operative and postoperative course he is doing quite well. He has lost about 25 pounds during his illness. He will begin cardiac rehabilitation in the near future. He is advised to keep his upcoming appointment with cardiology and cardiothoracic surgery. He is maintained on Plavix and aspirin.

## 2017-03-31 ENCOUNTER — Other Ambulatory Visit: Payer: Self-pay | Admitting: Family

## 2017-03-31 NOTE — Telephone Encounter (Signed)
I'm still working on yesterday afternoon's papers; sorry, I have checked the paperwork in office now and it has not been received/SLS 08/28

## 2017-03-31 NOTE — Telephone Encounter (Signed)
Robert Bennett-- have you seen these results come through yet?

## 2017-03-31 NOTE — Telephone Encounter (Signed)
Request refaxed.

## 2017-04-01 ENCOUNTER — Telehealth: Payer: Self-pay | Admitting: *Deleted

## 2017-04-01 DIAGNOSIS — E785 Hyperlipidemia, unspecified: Secondary | ICD-10-CM

## 2017-04-01 NOTE — Telephone Encounter (Signed)
-----   Message from Debbrah Alar, NP sent at 03/31/2017  5:07 PM EDT ----- Potassium still elevated. He should continue to hold potasssium supplement. Stop aldactone if he is still taking. Repeat bmet in 1 week.

## 2017-04-01 NOTE — Telephone Encounter (Signed)
Notified pt's spouse of below results. She states pt is currently in the hospital. He was readmitted on ?Monday and may be discharged today. She will call us back once pt has been discharged.

## 2017-04-03 ENCOUNTER — Telehealth: Payer: Self-pay | Admitting: Family

## 2017-04-03 NOTE — Telephone Encounter (Signed)
Pt has already has hospital fu with visit on Monday 03/30/17. Pt says that per the [facility] hospital he need to have a kidney function lab completed, they advised that he could come back there or have it done by PCP.     Please advise.

## 2017-04-03 NOTE — Telephone Encounter (Signed)
Please contact pt to arrange hospital follow up with me in 1-2 weeks.

## 2017-04-03 NOTE — Telephone Encounter (Signed)
Pt was seen on 03/30/17 for a hospital follow up and had readmission on 03/31/17, He needs another follow up with PCP and she will address any labs needed at that follow up. If he needs appt earlier than 2 weeks ok to schedule for next week if appointment available.

## 2017-04-06 NOTE — Telephone Encounter (Signed)
Noted. Ronecia, could you please contact pt to arrange TCM follow up?

## 2017-04-08 NOTE — Telephone Encounter (Signed)
Unable to reach patient at this time for Hospital Follow-up call. Left message for patient to return call when available.

## 2017-04-09 DIAGNOSIS — I420 Dilated cardiomyopathy: Secondary | ICD-10-CM | POA: Insufficient documentation

## 2017-04-09 DIAGNOSIS — I255 Ischemic cardiomyopathy: Secondary | ICD-10-CM | POA: Insufficient documentation

## 2017-04-10 ENCOUNTER — Telehealth: Payer: Self-pay | Admitting: Family

## 2017-04-10 NOTE — Telephone Encounter (Signed)
Patient declines TCM/Hospital Follow-up visit at this time. He reported that he had been admitted to the hospital due to extremely low blood pressure; however they changed his medications & readings are better now, systolic is 01-751'W. Patient voiced that he followed-up with his cardiologist on yesterday.

## 2017-04-10 NOTE — Telephone Encounter (Signed)
Pt wife sylvia dropped off VA forms to be filled out by [employer]. Please call her [phone] when ready to be picked up. Placed forms in front office tray.cb

## 2017-04-10 NOTE — Telephone Encounter (Signed)
Noted  

## 2017-04-14 ENCOUNTER — Telehealth: Payer: Self-pay | Admitting: *Deleted

## 2017-04-14 NOTE — Telephone Encounter (Signed)
Request has been refaxed. Awaiting results.

## 2017-04-14 NOTE — Telephone Encounter (Signed)
Has this come through yet? I have not see. Thanks!

## 2017-04-14 NOTE — Telephone Encounter (Signed)
Received Medical Records from [facility], along with patient typed letter & a [7]-page, very detailed set of forms regarding all information on Heart condition [including HTN & HLD]; forwarded to provider/SLS 09/11

## 2017-04-17 ENCOUNTER — Telehealth: Payer: Self-pay | Admitting: Family

## 2017-04-17 NOTE — Telephone Encounter (Signed)
Spoke to patient. Reviewed his MET capacity. Reports that he has SOB with climbing steps. Not having sob with ADL's such as bathing, eating, dressing or showering. Advised pt that we would leave form at the front desk for pick up. He will pick up on Monday.

## 2017-04-20 ENCOUNTER — Telehealth: Payer: Self-pay | Admitting: Family

## 2017-04-20 NOTE — Telephone Encounter (Signed)
Pt's wife dropped off a letter wanting for provider to sign it for pt to take to work and for [employer] paperwork. One page letter- Document given to [facility].

## 2017-04-20 NOTE — Telephone Encounter (Signed)
Please call when ready tel [phone]

## 2017-04-21 NOTE — Telephone Encounter (Signed)
Letter is requesting FMLA time frame be extended to 08/03/18 on behalf of pt's spouse as his care giver. Currently I am not seeing any FMLA scanned to pt's chart on spouse's behalf. Left message with pt to have spouse return my call.

## 2017-04-23 ENCOUNTER — Telehealth: Payer: Self-pay | Admitting: Family

## 2017-04-23 NOTE — Telephone Encounter (Signed)
Pt dropped off document to be filled out by provider (Medical Opinion document - 2 pages) Pt also brought in copies of documents that was already filled out by provider just in case provider needed information to fill out the Medical Opinion Document. Pt would like to be called when document ready. Document put at front office tray.

## 2017-04-24 NOTE — Telephone Encounter (Signed)
Received and forwarded to provider/SLS 09/21

## 2017-04-24 NOTE — Telephone Encounter (Signed)
Spoke with pt's spouse regarding letter. She states her original FMLA papers as pt's caregiver were completed by Dr Lunette Stands (cardiothoracic surgeon). She reached out to him requesting the extension and was instructed to contact pt's PCP. Advised spouse that PCP will need a copy of initial FMLA before she can do the letter. Pt does not have a copy and states HR will not fax it to [place]. Advised her to contact Dr Judeth Porch office Monday to see if the will fax a copy to our office. She will update me of outcome on Monday.

## 2017-05-01 ENCOUNTER — Encounter: Payer: Self-pay | Admitting: Family

## 2017-05-01 NOTE — Telephone Encounter (Signed)
Received Addended pages [2] back from provider; patient informed ready for p/u during regular business hours; understood & agreed/SLS 09/28

## 2017-06-12 ENCOUNTER — Telehealth: Payer: Self-pay | Admitting: *Deleted

## 2017-06-12 NOTE — Telephone Encounter (Signed)
Received request for Excusal from [employer] with attached forms from [place] requesting 'reasaonable accommodations'; forwarded to provider/SLS 11/09

## 2017-06-15 ENCOUNTER — Encounter: Payer: Self-pay | Admitting: Family

## 2017-06-16 NOTE — Telephone Encounter (Signed)
Letter placed in red folder to return to [place], [place].

## 2017-06-17 NOTE — Telephone Encounter (Signed)
Yes, I will bring it to office Friday AM when I return.

## 2017-06-17 NOTE — Telephone Encounter (Signed)
Melissa, do you have the original [place] and the attached [place] paperwork from 06/12/17, so that I can forward to the appropriate office?/SLS 11/14

## 2017-06-19 NOTE — Telephone Encounter (Signed)
Patient informed paperwork ready, understood & agreed to p/u forms during regular business hours/SLS 11/16

## 2017-07-09 DIAGNOSIS — Z951 Presence of aortocoronary bypass graft: Secondary | ICD-10-CM | POA: Insufficient documentation

## 2018-02-15 ENCOUNTER — Encounter: Payer: Self-pay | Admitting: Family

## 2018-02-15 ENCOUNTER — Ambulatory Visit (INDEPENDENT_AMBULATORY_CARE_PROVIDER_SITE_OTHER): Admitting: Family

## 2018-02-15 VITALS — BP 100/69 | HR 56 | Temp 98.2°F | Resp 16 | Ht 76.0 in | Wt 168.8 lb

## 2018-02-15 DIAGNOSIS — M25512 Pain in left shoulder: Secondary | ICD-10-CM

## 2018-02-15 DIAGNOSIS — R1011 Right upper quadrant pain: Secondary | ICD-10-CM | POA: Diagnosis not present

## 2018-02-15 NOTE — Patient Instructions (Signed)
You should be contacted about your referral to orthopedics and the surgeon.  Go to ER if severe recurrent abdominal pain.

## 2018-02-15 NOTE — Progress Notes (Signed)
Subjective:    Patient ID: Robert Bennett, male    DOB: [birth year], 52 y.o.   MRN: [id number]  HPI  Robert Bennett is a 52 yr old male who presents today with chief complaint of left shoulder pain/arm pain.  Has been present x 1 month and is worsened by lifting/throwing and laying on his left arm.  Denies known injury, Right handed.   RUQ pain- was seen recently in the ED for the same pain and was told that he has gallbladder sludge. Had some symptoms at 1 AM last night. Not as severe as when he went to the ER.  ER record is reviewed.   Review of Systems See HPI  Past Medical History:  Diagnosis Date  . 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  Dr. Estill Dooms  . GERD (gastroesophageal reflux disease)   . Hyperlipidemia    Previously on lipitor and simvastatin  . Hypertension    history of elevated BP readings     Social History   Socioeconomic History  . Marital status: Married    Spouse name: Not on file  . Number of children: 2  . Years of education: Not on file  . Highest education level: Not on file  Occupational History  . Occupation: Copywriter, advertising: [employer]  Social Needs  . Financial resource strain: Not on file  . Food insecurity:    Worry: Not on file    Inability: Not on file  . Transportation needs:    Medical: Not on file    Non-medical: Not on file  Tobacco Use  . Smoking status: Never Smoker  . Smokeless tobacco: Never Used  Substance and Sexual Activity  . Alcohol use: Yes    Comment: occasional   . Drug use: No  . Sexual activity: Not on file  Lifestyle  . Physical activity:    Days per week: Not on file    Minutes per session: Not on file  . Stress: Not on file  Relationships  . Social connections:    Talks on phone: Not on file    Gets together: Not on file    Attends religious service: Not on file    Active member of club or organization: Not on file    Attends meetings of clubs or organizations: Not on file   Relationship status: Not on file  . Intimate partner violence:    Fear of current or ex partner: Not on file    Emotionally abused: Not on file    Physically abused: Not on file    Forced sexual activity: Not on file  Other Topics Concern  . Not on file  Social History Narrative   Grew up in [place], [place]   Raised by grandparents   Working with [employer].      Past Surgical History:  Procedure Laterality Date  . CORONARY ARTERY BYPASS GRAFT     x 3 8/18 At [facility]  . NO PAST SURGERIES      Family History  Problem Relation Age of Onset  . Cancer Neg Hx        negative for colon and prostate    No Known Allergies  Current Outpatient Medications on File Prior to Visit  Medication Sig Dispense Refill  . apixaban (ELIQUIS) 5 MG TABS tablet Take 1 tablet by mouth 2 (two) times daily.    Marland Kitchen aspirin EC 81 MG tablet Take 1 tablet (81 mg total) by  mouth daily.    . clopidogrel (PLAVIX) 75 MG tablet Take 75 mg by mouth daily.    . digoxin (DIGOX) 0.125 MG tablet Take 1 tablet by mouth daily.    . finasteride (PROSCAR) 5 MG tablet Take 1/2 tablet by mouth three times a week.    . furosemide (LASIX) 80 MG tablet Take 40 mg by mouth daily.    Marland Kitchen lisinopril (PRINIVIL,ZESTRIL) 2.5 MG tablet Take 1 tablet by mouth daily.  1  . metoprolol succinate (TOPROL-XL) 25 MG 24 hr tablet Take 12.5 mg by mouth at bedtime.    . rosuvastatin (CRESTOR) 40 MG tablet Take 1 tablet (40 mg total) by mouth daily. 30 tablet 1   No current facility-administered medications on file prior to visit.     BP 100/69 (BP Location: Right Arm, Cuff Size: Normal)   Pulse (!) 56   Temp 98.2 F (36.8 C) (Oral)   Resp 16   Ht 6\' 4"  (1.93 m)   Wt 168 lb 12.8 oz (76.6 kg)   SpO2 100%   BMI 20.55 kg/m       Objective:   Physical Exam  Constitutional: He is oriented to person, place, and time. He appears well-developed and well-nourished. No distress.  HENT:  Head: Normocephalic and atraumatic.    Cardiovascular: Normal rate and regular rhythm.  No murmur heard. Pulmonary/Chest: Effort normal and breath sounds normal. No respiratory distress. He has no wheezes. He has no rales.  Abdominal: Soft. Bowel sounds are normal. He exhibits no distension. There is no tenderness. There is no guarding.  Musculoskeletal: He exhibits no edema.  + pain with abduction, shoulder extension, + empty can test, no shoulder tenderness or swelling.   Neurological: He is alert and oriented to person, place, and time.  Skin: Skin is warm and dry.  Psychiatric: He has a normal mood and affect. His behavior is normal. Thought content normal.          Assessment & Plan:  RUQ pain- refer to general surgeon for further evaluation for probable chronic cholecystitis. Pt is advised to go to the ER if he develops severe/recurrent abdominal pain. Discussed avoiding greasy foods.  L shoulder pain- New. suspect rotator cuff strain, refer to ortho.

## 2018-02-17 ENCOUNTER — Telehealth: Payer: Self-pay | Admitting: *Deleted

## 2018-02-17 DIAGNOSIS — R1011 Right upper quadrant pain: Secondary | ICD-10-CM

## 2018-02-17 NOTE — Telephone Encounter (Signed)
See mychart.  

## 2018-02-17 NOTE — Telephone Encounter (Signed)
Copied from [place] [id number]. Topic: General - Other >> Feb 17, 2018 10:13 AM Yvette Rack wrote: Reason for CRM: Pt wife states she spoke with Imaging and was told they have not received a request. Mrs Auletta is asking that pt be contacted in regards to Nuclear Imaging exam. Cb# [phone]

## 2018-02-17 NOTE — Telephone Encounter (Signed)
Melissa -- please advise?  When pt was in the office he was asking about a test to check gallbladder function (?HIDA scan). Maybe that is what wife is referring to?

## 2018-02-24 ENCOUNTER — Ambulatory Visit (HOSPITAL_COMMUNITY)
Admission: RE | Admit: 2018-02-24 | Discharge: 2018-02-24 | Disposition: A | Discharge status: Home / Self Care | Source: Ambulatory Visit | Attending: Family | Admitting: Family

## 2018-02-24 DIAGNOSIS — R1011 Right upper quadrant pain: Secondary | ICD-10-CM | POA: Insufficient documentation

## 2018-02-24 MED ORDER — TECHNETIUM TC 99M MEBROFENIN IV KIT
5.3000 | PACK | Freq: Once | INTRAVENOUS | Status: AC | PRN
  Administered 2018-02-24: 5.3 via INTRAVENOUS

## 2018-02-28 ENCOUNTER — Telehealth: Payer: Self-pay | Admitting: Family

## 2018-02-28 NOTE — Telephone Encounter (Signed)
See mychart.  

## 2018-03-24 ENCOUNTER — Telehealth: Payer: Self-pay | Admitting: Family

## 2018-03-24 NOTE — Telephone Encounter (Signed)
Copied from [place] [phone] [id number]. Topic: Quick Communication - See Telephone Encounter >> Mar 24, 2018 10:16 AM Rosalin Hawking wrote: CRM for notification. See Telephone encounter for: 03/24/18.   Pt dropped off document to be filled out by provider (VA document for request of medical documents- 2 pages) Pt would like to be called when document ready to pick up. Document put at front office tray under providers name.

## 2018-03-25 NOTE — Telephone Encounter (Signed)
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

## 2019-05-12 ENCOUNTER — Other Ambulatory Visit: Payer: Self-pay

## 2019-05-13 ENCOUNTER — Ambulatory Visit (INDEPENDENT_AMBULATORY_CARE_PROVIDER_SITE_OTHER): Admitting: Family

## 2019-05-13 ENCOUNTER — Encounter: Payer: Self-pay | Admitting: Family

## 2019-05-13 VITALS — BP 110/76 | HR 73 | Temp 97.5°F | Resp 16 | Ht 74.0 in | Wt 183.0 lb

## 2019-05-13 DIAGNOSIS — I2510 Atherosclerotic heart disease of native coronary artery without angina pectoris: Secondary | ICD-10-CM | POA: Diagnosis not present

## 2019-05-13 DIAGNOSIS — R739 Hyperglycemia, unspecified: Secondary | ICD-10-CM

## 2019-05-13 DIAGNOSIS — I5022 Chronic systolic (congestive) heart failure: Secondary | ICD-10-CM

## 2019-05-13 DIAGNOSIS — L649 Androgenic alopecia, unspecified: Secondary | ICD-10-CM

## 2019-05-13 DIAGNOSIS — E785 Hyperlipidemia, unspecified: Secondary | ICD-10-CM

## 2019-05-13 MED ORDER — FINASTERIDE 5 MG PO TABS: 5.0000 mg | ORAL_TABLET | Freq: Every day | ORAL | 3 refills | Status: DC

## 2019-05-13 NOTE — Progress Notes (Signed)
Subjective:    Patient ID: Robert Bennett, male    DOB: 08-11-1965, 53 y.o.   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  He reports that he has been on finasteride x 2.5 years.  Feels that his hair has maintained over this time.  Hx of CAD- maintained on statin, plavix, beta blocker and digoxin. These meds are being prescribed by cardiology.   Hyperlipidemia-  His cardiologist has been prescribing his crestor 40mg . Lab Results  Component Value Date   CHOL 231 (H) 09/26/2016   HDL 49 09/26/2016   LDLCALC 152 (H) 09/26/2016   TRIG 152 (H) 09/26/2016   CHOLHDL 4.7 09/26/2016    Review of Systems See HPI  Past Medical History:  Diagnosis Date  . 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  Dr. Estill Dooms  . GERD (gastroesophageal reflux disease)   . Hyperlipidemia    Previously on lipitor and simvastatin  . Hypertension    history of elevated BP readings     Social History   Socioeconomic History  . Marital status: Married    Spouse name: Not on file  . Number of children: 2  . Years of education: Not on file  . Highest education level: Not on file  Occupational History  . Occupation: Copywriter, advertising: [employer]  Social Needs  . Financial resource strain: Not on file  . Food insecurity    Worry: Not on file    Inability: Not on file  . Transportation needs    Medical: Not on file    Non-medical: Not on file  Tobacco Use  . Smoking status: Never Smoker  . Smokeless tobacco: Never Used  Substance and Sexual Activity  . Alcohol use: Yes    Comment: occasional   . Drug use: No  . Sexual activity: Not on file  Lifestyle  . Physical activity    Days per week: Not on file    Minutes per session: Not on file  . Stress: Not on file  Relationships  . Social Herbalist on phone: Not on file    Gets together: Not on file    Attends religious service:  Not on file    Active member of club or organization: Not on file    Attends meetings of clubs or organizations: Not on file    Relationship status: Not on file  . Intimate partner violence    Fear of current or ex partner: Not on file    Emotionally abused: Not on file    Physically abused: Not on file    Forced sexual activity: Not on file  Other Topics Concern  . Not on file  Social History Narrative   Grew up in [place], [place]   Raised by grandparents   Working with [employer].      Past Surgical History:  Procedure Laterality Date  . CORONARY ARTERY BYPASS GRAFT     x 3 8/18 At [facility]  . NO PAST SURGERIES      Family History  Problem Relation Age of Onset  . Cancer Neg Hx        negative for colon and prostate    No Known Allergies  Current Outpatient Medications on File Prior to Visit  Medication Sig Dispense Refill  . apixaban (ELIQUIS) 5 MG TABS tablet Take 1 tablet by mouth  2 (two) times daily.    Marland Kitchen aspirin EC 81 MG tablet Take 1 tablet (81 mg total) by mouth daily.    . clopidogrel (PLAVIX) 75 MG tablet Take 75 mg by mouth daily.    . digoxin (DIGOX) 0.125 MG tablet Take 1 tablet by mouth daily.    . furosemide (LASIX) 80 MG tablet Take 40 mg by mouth daily.    Marland Kitchen lisinopril (PRINIVIL,ZESTRIL) 2.5 MG tablet Take 1 tablet by mouth daily.  1  . metoprolol succinate (TOPROL-XL) 25 MG 24 hr tablet Take 12.5 mg by mouth at bedtime.    . rosuvastatin (CRESTOR) 40 MG tablet Take 1 tablet (40 mg total) by mouth daily. 30 tablet 1   No current facility-administered medications on file prior to visit.     BP 110/76 (BP Location: Right Arm, Patient Position: Sitting, Cuff Size: Small)   Pulse 73   Temp (!) 97.5 F (36.4 C) (Temporal)   Resp 16   Ht 6\' 2"  (1.88 m)   Wt 183 lb (83 kg)   SpO2 100%   BMI 23.50 kg/m       Objective:   Physical Exam Constitutional:      General: He is not in acute distress.    Appearance: He is well-developed.  HENT:      Head: Normocephalic and atraumatic.  Cardiovascular:     Rate and Rhythm: Normal rate and regular rhythm.     Heart sounds: No murmur.  Pulmonary:     Effort: Pulmonary effort is normal. No respiratory distress.     Breath sounds: Normal breath sounds. No wheezing or rales.  Skin:    General: Skin is warm and dry.     Comments: No significant hair thinning noted on head  Neurological:     Mental Status: He is alert and oriented to person, place, and time.  Psychiatric:        Behavior: Behavior normal.        Thought Content: Thought content normal.           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  Hyperlipidemia- tolerating statin, obtain follow up lipid panel.  Hx of CAD/chronic systolic heart failure- clinically stable- appears euvolemic.  Following with cardiology. Dr. Alric Ran  Hyperglycemia- obtain follow up A1C.

## 2019-05-13 NOTE — Patient Instructions (Signed)
Please complete lab work prior to leaving.   

## 2019-05-14 LAB — COMPREHENSIVE METABOLIC PANEL
AG Ratio: 1.6 (calc) (ref 1.0–2.5)
ALT: 33 U/L (ref 9–46)
AST: 23 U/L (ref 10–35)
Albumin: 4.4 g/dL (ref 3.6–5.1)
Alkaline phosphatase (APISO): 86 U/L (ref 35–144)
BUN: 22 mg/dL (ref 7–25)
CO2: 25 mmol/L (ref 20–32)
Calcium: 10.3 mg/dL (ref 8.6–10.3)
Chloride: 101 mmol/L (ref 98–110)
Creat: 1.09 mg/dL (ref 0.70–1.33)
Globulin: 2.7 g/dL (calc) (ref 1.9–3.7)
Glucose, Bld: 113 mg/dL — ABNORMAL HIGH (ref 65–99)
Potassium: 3.8 mmol/L (ref 3.5–5.3)
Sodium: 138 mmol/L (ref 135–146)
Total Bilirubin: 0.4 mg/dL (ref 0.2–1.2)
Total Protein: 7.1 g/dL (ref 6.1–8.1)

## 2019-05-14 LAB — LIPID PANEL
Cholesterol: 184 mg/dL (ref <200)
HDL: 44 mg/dL (ref >=40)
LDL Cholesterol (Calc): 120 mg/dL (calc) — ABNORMAL HIGH
Non-HDL Cholesterol (Calc): 140 mg/dL (calc) — ABNORMAL HIGH (ref <130)
Total CHOL/HDL Ratio: 4.2 (calc) (ref <5.0)
Triglycerides: 98 mg/dL (ref <150)

## 2019-05-14 LAB — HEMOGLOBIN A1C
Hgb A1c MFr Bld: 6.2 % of total Hgb — ABNORMAL HIGH (ref <5.7)
Mean Plasma Glucose: 131 (calc)
eAG (mmol/L): 7.3 (calc)

## 2019-05-16 ENCOUNTER — Telehealth: Payer: Self-pay | Admitting: Family

## 2019-05-16 ENCOUNTER — Encounter: Payer: Self-pay | Admitting: Family

## 2019-05-16 NOTE — Telephone Encounter (Signed)
Pharmacy needs clarification on instructions for finasteride (PROSCAR) 5 MG tablet  Two sets of instruction. Please advise. [facility] [address] - [place], [place] - [address] [phone] (Phone) [phone] (Fax)

## 2019-05-17 ENCOUNTER — Other Ambulatory Visit: Payer: Self-pay

## 2019-05-17 MED ORDER — FINASTERIDE 5 MG PO TABS: 5.0000 mg | ORAL_TABLET | Freq: Every day | ORAL | 3 refills | Status: DC

## 2019-05-17 NOTE — Telephone Encounter (Signed)
Instructions should read 1 tablet by mouth once daily please.

## 2019-05-17 NOTE — Telephone Encounter (Signed)
[facility] advised of dosage

## 2019-05-30 NOTE — Telephone Encounter (Signed)
Direction given to Jerrie at [facility] she said the rx came in again with same previous directions. Advised directions are 1qhs.

## 2019-05-30 NOTE — Telephone Encounter (Signed)
[employer] calling again to request call back from CMA with sig and dosage.

## 2020-05-04 ENCOUNTER — Telehealth: Payer: Self-pay | Admitting: Family

## 2020-05-04 NOTE — Telephone Encounter (Signed)
Pt dropped off document to be filled out by provider ( 8 pages - [place]- pt also left Copies of records of his Heart condition just if provider needing the info 9 pages) Pt would like to be called when document ready to pick up at [phone]. Document put at front office tray under providers name.

## 2020-05-07 NOTE — Telephone Encounter (Signed)
Patient has not been seen in one year, he was scheduled for ov 05-14-2020

## 2020-05-08 ENCOUNTER — Telehealth: Payer: Self-pay | Admitting: Family

## 2020-05-08 DIAGNOSIS — Z0279 Encounter for issue of other medical certificate: Secondary | ICD-10-CM

## 2020-05-08 NOTE — Telephone Encounter (Signed)
Please advise pt that I reviewed  His request for covid-19 shot exemption.  I think it is extra important for he to get vaccinated due to his heart condition.  I see in his cardiologist's notes that his cardiologist also recommended this to him.   I completed his FMLA and have placed this in your red folder.

## 2020-05-09 NOTE — Telephone Encounter (Signed)
Patient advised of provider advise to get vaccinated for Covid 19. His FMLA was left upfront for him to pick up (at his request) copy sent for scanning.

## 2020-05-14 ENCOUNTER — Ambulatory Visit: Admitting: Family

## 2020-05-21 ENCOUNTER — Other Ambulatory Visit: Payer: Self-pay

## 2020-05-21 ENCOUNTER — Ambulatory Visit (INDEPENDENT_AMBULATORY_CARE_PROVIDER_SITE_OTHER): Admitting: Family

## 2020-05-21 VITALS — BP 115/80 | HR 85 | Temp 98.5°F | Resp 16 | Ht 74.0 in | Wt 185.6 lb

## 2020-05-21 DIAGNOSIS — I255 Ischemic cardiomyopathy: Secondary | ICD-10-CM

## 2020-05-21 DIAGNOSIS — E1169 Type 2 diabetes mellitus with other specified complication: Secondary | ICD-10-CM | POA: Diagnosis not present

## 2020-05-21 DIAGNOSIS — I5022 Chronic systolic (congestive) heart failure: Secondary | ICD-10-CM

## 2020-05-21 DIAGNOSIS — I2510 Atherosclerotic heart disease of native coronary artery without angina pectoris: Secondary | ICD-10-CM | POA: Diagnosis not present

## 2020-05-21 DIAGNOSIS — I82621 Acute embolism and thrombosis of deep veins of right upper extremity: Secondary | ICD-10-CM

## 2020-05-21 DIAGNOSIS — I420 Dilated cardiomyopathy: Secondary | ICD-10-CM

## 2020-05-21 DIAGNOSIS — R739 Hyperglycemia, unspecified: Secondary | ICD-10-CM | POA: Diagnosis not present

## 2020-05-21 DIAGNOSIS — E785 Hyperlipidemia, unspecified: Secondary | ICD-10-CM

## 2020-05-21 DIAGNOSIS — Z7185 Encounter for immunization safety counseling: Secondary | ICD-10-CM | POA: Diagnosis not present

## 2020-05-21 MED ORDER — METOPROLOL SUCCINATE ER 25 MG PO TB24: 25.0000 mg | ORAL_TABLET | Freq: Every day | ORAL | Status: AC

## 2020-05-21 NOTE — Patient Instructions (Signed)
Please complete lab work.  

## 2020-05-21 NOTE — Progress Notes (Signed)
Subjective:    Patient ID: Robert Bennett, male    DOB: [birth year], 54 y.o.   MRN: [id number]  HPI  Patient is a 54 yr old male who presents today for routine follow up.  HTN- maintained on toprol xl 25mg  and lisinopril 2.5mg   BP Readings from Last 3 Encounters:  05/21/20 115/80  05/13/19 110/76  02/15/18 100/69    Hyperlipidemia- maintained on crestor 40mg .  Lab Results  Component Value Date   CHOL 184 05/13/2019   HDL 44 05/13/2019   LDLCALC 120 (H) 05/13/2019   TRIG 98 05/13/2019   CHOLHDL 4.2 05/13/2019   Hyperglycemia- maintained on diet alone.  Lab Results  Component Value Date   HGBA1C 6.2 (H) 05/13/2019   Hx of TIA- maintained on aspirin 81mg  and plavix 75mg .  Review of Systems See HPI  Past Medical History:  Diagnosis Date  . 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  Dr. Estill Dooms  . Chronic systolic heart failure ([place])   . GERD (gastroesophageal reflux disease)   . Hyperlipidemia    Previously on lipitor and simvastatin  . Hypertension    history of elevated BP readings     Social History   Socioeconomic History  . Marital status: Married    Spouse name: Not on file  . Number of children: 2  . Years of education: Not on file  . Highest education level: Not on file  Occupational History  . Occupation: CLAIMS    Employer: [employer]  Tobacco Use  . Smoking status: Never Smoker  . Smokeless tobacco: Never Used  Substance and Sexual Activity  . Alcohol use: Yes    Comment: occasional   . Drug use: No  . Sexual activity: Not on file  Other Topics Concern  . Not on file  Social History Narrative   Grew up in [place], [place]   Raised by grandparents   Working with [employer].     Social Determinants of Health   Financial Resource Strain:   . Difficulty of Paying Living Expenses: Not on file  Food Insecurity:   . Worried About Charity fundraiser in the Last Year: Not on file  . Ran Out of Food in the Last Year:  Not on file  Transportation Needs:   . Lack of Transportation (Medical): Not on file  . Lack of Transportation (Non-Medical): Not on file  Physical Activity:   . Days of Exercise per Week: Not on file  . Minutes of Exercise per Session: Not on file  Stress:   . Feeling of Stress : Not on file  Social Connections:   . Frequency of Communication with Friends and Family: Not on file  . Frequency of Social Gatherings with Friends and Family: Not on file  . Attends Religious Services: Not on file  . Active Member of Clubs or Organizations: Not on file  . Attends Archivist Meetings: Not on file  . Marital Status: Not on file  Intimate Partner Violence:   . Fear of Current or Ex-Partner: Not on file  . Emotionally Abused: Not on file  . Physically Abused: Not on file  . Sexually Abused: Not on file    Past Surgical History:  Procedure Laterality Date  . CORONARY ARTERY BYPASS GRAFT     x 3 8/18 At [facility]  . NO PAST SURGERIES      Family History  Problem Relation Age of Onset  . Cancer Neg Hx  negative for colon and prostate    No Known Allergies  Current Outpatient Medications on File Prior to Visit  Medication Sig Dispense Refill  . apixaban (ELIQUIS) 5 MG TABS tablet Take 1 tablet by mouth 2 (two) times daily.    Marland Kitchen aspirin EC 81 MG tablet Take 1 tablet (81 mg total) by mouth daily.    . clopidogrel (PLAVIX) 75 MG tablet Take 75 mg by mouth daily.    . digoxin (DIGOX) 0.125 MG tablet Take 1 tablet by mouth daily.    . finasteride (PROSCAR) 5 MG tablet Take 1 tablet (5 mg total) by mouth daily. 90 tablet 3  . lisinopril (PRINIVIL,ZESTRIL) 2.5 MG tablet Take 1 tablet by mouth daily.  1  . rosuvastatin (CRESTOR) 40 MG tablet Take 1 tablet (40 mg total) by mouth daily. 30 tablet 1  . furosemide (LASIX) 80 MG tablet Take 40 mg by mouth daily.    . metoprolol succinate (TOPROL-XL) 25 MG 24 hr tablet Take 12.5 mg by mouth at bedtime.     No current  facility-administered medications on file prior to visit.    BP 115/80 (BP Location: Right Arm, Patient Position: Sitting, Cuff Size: Small)   Pulse 85   Temp 98.5 F (36.9 C) (Oral)   Resp 16   Ht 6\' 2"  (1.88 m)   Wt 185 lb 9.6 oz (84.2 kg)   SpO2 100%   BMI 23.83 kg/m       Objective:   Physical Exam Constitutional:      Appearance: Normal appearance.  HENT:     Head: Normocephalic and atraumatic.  Musculoskeletal:        General: No swelling.  Neurological:     Mental Status: He is alert and oriented to person, place, and time.  Psychiatric:        Attention and Perception: Attention normal.        Speech: Speech normal.        Thought Content: Thought content normal.        Cognition and Memory: Cognition normal.           Assessment & Plan:  HTN- bp is stable on current medications- obtain cmet.  Hyperglycemia- obtain follow up A1C.  Hyperlipidemia- obtain follow up lipid panel.  Continue crestor 40mg .   Hx of TIA- continue statin, aspirin, plavix.   The majority of today's visit was spent counseling pt on the importance of him receiving the covid vaccine.  He is requesting that I sign a medical exemption for his employer and that I document that I counseled him on the vaccine. I advised pt that I would document that I counseled him that he SHOULD get the covid vaccine.  He is concerned about risk of myocarditis.  I advised him that his risk of myocarditis from vaccination is [facility] lower than his risk of severe illness, covid induced myocarditis and possibly death from covid if he remains unvaccinated due to his multiple medical conditions that increase his risk.  Pt was very upset that I did not support his medical exemption. States that he is about to lose his job due to his refusal to be vaccinated and because I will not support his medical exemption, now "I just lost my job."  I advised pt that I believe vaccination is in his best medical interest and he still  has the choice available to him to be vaccinated if he wishes to keep his current job.   He also declined  to stop by the lab today to complete recommended tests.   41 minutes spent on today's visit.  The majority of the time was spent counseling patient and reviewing his medical record.   This visit occurred during the SARS-CoV-2 public health emergency.  Safety protocols were in place, including screening questions prior to the visit, additional usage of staff PPE, and extensive cleaning of exam room while observing appropriate contact time as indicated for disinfecting solutions.

## 2020-07-07 ENCOUNTER — Other Ambulatory Visit: Payer: Self-pay | Admitting: Family
